# Patient Record
Sex: Female | Born: 1990 | Race: Black or African American | Hispanic: No | Marital: Single | Smoking: Never smoker
Health system: Southern US, Community
[De-identification: ages and names within clinical notes are randomized; demographics above are authoritative.]

## PROBLEM LIST (undated history)

## (undated) ENCOUNTER — Inpatient Hospital Stay (HOSPITAL_COMMUNITY): Payer: Self-pay

## (undated) DIAGNOSIS — N200 Calculus of kidney: Secondary | ICD-10-CM

## (undated) HISTORY — PX: KIDNEY STONE SURGERY: SHX686

---
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
## 2008-02-26 ENCOUNTER — Emergency Department (HOSPITAL_COMMUNITY): Admission: EM | Admit: 2008-02-26 | Discharge: 2008-02-27 | Payer: Self-pay | Admitting: Emergency Medicine

## 2009-08-09 ENCOUNTER — Emergency Department (HOSPITAL_COMMUNITY): Admission: EM | Admit: 2009-08-09 | Discharge: 2009-08-09 | Payer: Self-pay | Admitting: Emergency Medicine
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
## 2010-03-18 ENCOUNTER — Emergency Department (HOSPITAL_COMMUNITY)
Admission: EM | Admit: 2010-03-18 | Discharge: 2010-03-18 | Disposition: A | Discharge status: Home / Self Care | Payer: Medicaid Other | Attending: Emergency Medicine | Admitting: Emergency Medicine

## 2010-03-18 ENCOUNTER — Emergency Department (HOSPITAL_COMMUNITY): Payer: Medicaid Other

## 2010-03-18 DIAGNOSIS — W268XXA Contact with other sharp object(s), not elsewhere classified, initial encounter: Secondary | ICD-10-CM | POA: Insufficient documentation

## 2010-03-18 DIAGNOSIS — Y93G1 Activity, food preparation and clean up: Secondary | ICD-10-CM | POA: Insufficient documentation

## 2010-03-18 DIAGNOSIS — S61409A Unspecified open wound of unspecified hand, initial encounter: Secondary | ICD-10-CM | POA: Insufficient documentation

## 2010-03-19 ENCOUNTER — Emergency Department (HOSPITAL_COMMUNITY)
Admission: EM | Admit: 2010-03-19 | Discharge: 2010-03-19 | Disposition: A | Discharge status: Home / Self Care | Payer: Medicaid Other | Attending: Emergency Medicine | Admitting: Emergency Medicine

## 2010-03-19 DIAGNOSIS — M79609 Pain in unspecified limb: Secondary | ICD-10-CM | POA: Insufficient documentation

## 2010-04-29 LAB — URINALYSIS, ROUTINE W REFLEX MICROSCOPIC
Glucose, UA: NEGATIVE mg/dL
Ketones, ur: NEGATIVE mg/dL
Protein, ur: NEGATIVE mg/dL
Urobilinogen, UA: 0.2 mg/dL (ref 0.0–1.0)

## 2010-04-29 LAB — POCT PREGNANCY, URINE: Preg Test, Ur: NEGATIVE

## 2010-05-28 LAB — CARBOXYHEMOGLOBIN
Carboxyhemoglobin: 0.9 % (ref 0.5–1.5)
Methemoglobin: 0.4 % (ref 0.0–1.5)
O2 Saturation: 46 %
Total hemoglobin: 11.6 g/dL — ABNORMAL LOW (ref 12.5–16.0)

## 2010-06-26 NOTE — Op Note (Signed)
Amy Dickson, Amy Dickson               ACCOUNT NO.:  [id number]   MEDICAL RECORD NO.:  [id number]          PATIENT TYPE:  AMB   LOCATION:  [facility]                         FACILITY:  [facility]   PHYSICIAN:  Mark C. Vernie Ammons, M.D.  DATE OF BIRTH:  [birth year]   DATE OF PROCEDURE:  10/12/2007  DATE OF DISCHARGE:                               OPERATIVE REPORT   PREOPERATIVE DIAGNOSES:  1. History of right ureteral calculus.  2. Left-sided flank pain.   POSTOPERATIVE DIAGNOSES:  1. History of right ureteral calculus.  2. Left ureteral calculus.   PROCEDURES:  1. Cystoscopy.  2. Ureteral catheterization.  3. Bilateral retrograde pyelograms with interpretation.  4. Bilateral ureteroscopy.  5. Left ureteroscopic stone extraction.   SURGEON:  Mark C. Vernie Ammons, M.D.   ANESTHESIA:  General.   DRAINS:  None.   SPECIMENS:  Stone sent for analysis.   BLOOD LOSS:  None.   COMPLICATIONS:  None.   INDICATIONS:  The patient is an 20 year old female who I initially saw  about 2 weeks ago with right flank pain that was evaluated in the  emergency room.  A CT scan revealed a 3 mm distal right ureteral  calculus and a 2 mm upper pole left renal calculus.  When she returned  for 1 week follow-up, she was continuing to have some right sided pain  but then complaint of some left-sided flank pain as well.  She reports  not having seen the stone pass, although she has not been straining her  urine.  She is brought to the operating room today for evaluation of the  right-hand side and extraction of the stone as well as left retrograde  pyelogram to rule out any abnormality on the left-hand side.  The risks,  complications and alternatives have been discussed.  She understands and  elected to proceed.   DESCRIPTION OF OPERATION:  After informed consent, the patient was  brought to the major OR, placed on the table, administered general  anesthesia, then moved to the dorsal lithotomy position.  Her  genitalia  was sterilely prepped and draped and an official time-out was then  performed.  Initially the 22-French cystoscope with 12 degree lens was  inserted in the bladder and the bladder was fully inspected and noted to  be free of any tumor, stones or inflammatory lesions.   Both ureteral orifices appeared entirely normal.  I first cannulated the  right ureteral orifice with a 6-French open-ended ureteral catheter.  I  then injected full strength contrast in order to perform a right  retrograde pyelogram.  This was done under direct fluoroscopy and  revealed no evidence of hydronephrosis and I did not see any definite  filling defects in the ureter.  Intrarenal collecting system appeared  normal as well.   A left retrograde pyelogram was then performed in an identical fashion  and in doing so, I did identify what appeared to be a filling defect in  the distal ureter about a centimeter above the UVJ.  No other  abnormality was identified.   Through the 6-French open-ended catheter, I passed a  0.038 inch floppy  tip guidewire up the left ureter and then used the inner portion of the  ureteral access sheath to very gently dilate the left distal ureter and  ureteral orifice.  I then removed the access sheath and inserted the 6-  French rigid ureteroscope next to the guidewire and passed this up the  ureter under direct visualization and was able to identify a stone.  It  was photographed, grasped with nitinol basket and extracted without  difficulty.  Because it was easily removed with minimal ureteral trauma,  I did not feel a stent was necessary on this side.   I then reinserted the cystoscope, passed the guidewire into the right  ureteral orifice and gently dilated the right distal ureter in a similar  fashion with the internal portion of the ureteral access sheath.  I then  removed that and passed the 6-French rigid ureteroscope into the right  orifice and gently passed this up  the ureter and found no stone present  in the distal right ureter.  I therefore drained the bladder and the  patient was awakened and taken to recovery in stable satisfactory  condition.  She tolerated the procedure well.  There were no  intraoperative complications.   She has a prescription for pain medication already and her stone will be  taken to my office and sent for compositional analysis.  I will then  contact her with the results of that and recommendations on how to  prevent further stone formation.      Mark C. Vernie Ammons, M.D.  Electronically Signed     MCO/MEDQ  D:  10/12/2007  T:  10/12/2007  Job:  [id number]

## 2010-11-09 LAB — URINE MICROSCOPIC-ADD ON

## 2010-11-09 LAB — URINALYSIS, ROUTINE W REFLEX MICROSCOPIC
Nitrite: NEGATIVE
Specific Gravity, Urine: 1.02
Urobilinogen, UA: 1
pH: 5.5

## 2010-11-09 LAB — DIFFERENTIAL
Basophils Absolute: 0
Basophils Relative: 0
Eosinophils Absolute: 0.1
Neutrophils Relative %: 89 — ABNORMAL HIGH

## 2010-11-09 LAB — BASIC METABOLIC PANEL
BUN: 3 — ABNORMAL LOW
CO2: 22
Calcium: 8.3 — ABNORMAL LOW
Chloride: 106
Creatinine, Ser: 0.7

## 2010-11-09 LAB — CBC
MCHC: 33
MCV: 93.7
Platelets: 251
RDW: 12.9

## 2013-01-08 ENCOUNTER — Encounter (HOSPITAL_COMMUNITY): Payer: Self-pay | Admitting: Family

## 2013-01-08 ENCOUNTER — Inpatient Hospital Stay (HOSPITAL_COMMUNITY)
Admission: AD | Admit: 2013-01-08 | Discharge: 2013-01-08 | Disposition: A | Discharge status: Home / Self Care | Payer: Medicaid Other | Source: Ambulatory Visit | Attending: Obstetrics and Gynecology | Admitting: Obstetrics and Gynecology

## 2013-01-08 DIAGNOSIS — R51 Headache: Secondary | ICD-10-CM | POA: Insufficient documentation

## 2013-01-08 DIAGNOSIS — O219 Vomiting of pregnancy, unspecified: Secondary | ICD-10-CM

## 2013-01-08 DIAGNOSIS — O210 Mild hyperemesis gravidarum: Secondary | ICD-10-CM | POA: Insufficient documentation

## 2013-01-08 DIAGNOSIS — R109 Unspecified abdominal pain: Secondary | ICD-10-CM | POA: Insufficient documentation

## 2013-01-08 HISTORY — DX: Calculus of kidney: N20.0

## 2013-01-08 LAB — URINALYSIS, ROUTINE W REFLEX MICROSCOPIC
Hgb urine dipstick: NEGATIVE
Nitrite: NEGATIVE
Protein, ur: NEGATIVE mg/dL
Urobilinogen, UA: 0.2 mg/dL (ref 0.0–1.0)

## 2013-01-08 LAB — POCT PREGNANCY, URINE: Preg Test, Ur: POSITIVE — AB

## 2013-01-08 MED ORDER — ONDANSETRON 8 MG PO TBDP
8.0000 mg | ORAL_TABLET | ORAL | Status: AC
  Administered 2013-01-08: 8 mg via ORAL
  Filled 2013-01-08: qty 1

## 2013-01-08 MED ORDER — PROMETHAZINE HCL 25 MG PO TABS: 12.5000 mg | ORAL_TABLET | Freq: Four times a day (QID) | ORAL | Status: DC | PRN

## 2013-01-08 MED ORDER — ONDANSETRON HCL 4 MG PO TABS: 4.0000 mg | ORAL_TABLET | Freq: Every day | ORAL | Status: DC | PRN

## 2013-01-08 NOTE — MAU Provider Note (Signed)
Chief Complaint: Emesis   First Provider Initiated Contact with Patient 01/08/13 1206     SUBJECTIVE HPI: Amy Dickson is a 22 y.o. G1P0 at [redacted]w[redacted]d by LMP who presents to maternity admissions reporting nausea and vomiting, with vomiting x4 in 24 hours.  She has appt at [facility] in December and currently does not have any nausea medications.  She is waiting for her Medicaid insurance, but has applied.  She reports some light cramping a few days ago but no pain today.  She denies abdominal pain, vaginal bleeding, vaginal itching/burning, urinary symptoms, h/a, dizziness, or fever/chills.   Patient's last menstrual period was 11/01/2012.  She is unsure of this date.  Past Medical History  Diagnosis Date  . Medical history non-contributory   . Kidney stones 2009   Past Surgical History  Procedure Laterality Date  . Kidney stone surgery     History   Social History  . Marital Status: Single    Spouse Name: N/A    Number of Children: N/A  . Years of Education: N/A   Occupational History  . Not on file.   Social History Main Topics  . Smoking status: Never Smoker   . Smokeless tobacco: Not on file  . Alcohol Use: No  . Drug Use: No  . Sexual Activity: Yes   Other Topics Concern  . Not on file   Social History Narrative  . No narrative on file   No current facility-administered medications on file prior to encounter.   No current outpatient prescriptions on file prior to encounter.   No Known Allergies  ROS: Pertinent items in HPI  OBJECTIVE Blood pressure 120/76, pulse 79, temperature 98.5 F (36.9 C), temperature source Oral, resp. rate 18, height 5\' 5"  (1.651 m), weight 50.066 kg (110 lb 6 oz), last menstrual period 11/01/2012. GENERAL: Well-developed, well-nourished female in no acute distress.  HEENT: Normocephalic HEART: normal rate, rhythm, heart sounds RESP: normal effort, lung sounds clear and equal bilaterally ABDOMEN: Soft, non-tender EXTREMITIES: Nontender,  no edema NEURO: Alert and oriented   LAB RESULTS Results for orders placed during the hospital encounter of 01/08/13 (from the past 24 hour(s))  URINALYSIS, ROUTINE W REFLEX MICROSCOPIC     Status: Abnormal   Collection Time    01/08/13 10:50 AM      Result Value Range   Color, Urine YELLOW  YELLOW   APPearance CLEAR  CLEAR   Specific Gravity, Urine 1.020  1.005 - 1.030   pH 6.5  5.0 - 8.0   Glucose, UA NEGATIVE  NEGATIVE mg/dL   Hgb urine dipstick NEGATIVE  NEGATIVE   Bilirubin Urine NEGATIVE  NEGATIVE   Ketones, ur 15 (*) NEGATIVE mg/dL   Protein, ur NEGATIVE  NEGATIVE mg/dL   Urobilinogen, UA 0.2  0.0 - 1.0 mg/dL   Nitrite NEGATIVE  NEGATIVE   Leukocytes, UA NEGATIVE  NEGATIVE  POCT PREGNANCY, URINE     Status: Abnormal   Collection Time    01/08/13 10:56 AM      Result Value Range   Preg Test, Ur POSITIVE (*) NEGATIVE    ASSESSMENT 1. Nausea/vomiting in pregnancy     PLAN Zofran ODT 8 mg x1 in MAU--pt tolerated PO fluids after medication Discharge home Zofran 4 mg PO Q 8 hours PRN Phenergan 12.5-25 mg PO Q 6 hours PRN Outpatient viability U/S ordered F/U as scheduled at health department Return to MAU as needed, or for vaginal bleeding or abdominal pain    Medication List  ASK your doctor about these medications       prenatal multivitamin Tabs tablet  Take 1 tablet by mouth daily at 12 noon.         Sharen Counter Certified Nurse-Midwife 01/08/2013  12:12 PM

## 2013-01-08 NOTE — MAU Note (Signed)
Pt states here for excessive vomiting. Has had hyperemesis however seems to have worsened. Denies diarrhea. Has also had headaches. Unable to keep water down. Last attempted food yesterday around 1330. Denies bleeding or abnormal vaginal discharge. LMP-11/01/2012

## 2013-01-08 NOTE — MAU Note (Signed)
22 yo, G1P0 at [redacted]w[redacted]d, presents to MAU with c/o N/V x 2 days. No medications taken at this time, only prenatal. Denies VB.

## 2013-01-09 NOTE — MAU Provider Note (Signed)
Attestation of Attending Supervision of Advanced Practitioner: Evaluation and management procedures were performed by the PA/NP/CNM/OB Fellow under my supervision/collaboration. Chart reviewed and agree with management and plan.  Leta Bucklin V 01/09/2013 10:13 PM

## 2013-01-15 ENCOUNTER — Ambulatory Visit (HOSPITAL_COMMUNITY)
Admission: RE | Admit: 2013-01-15 | Discharge: 2013-01-15 | Disposition: A | Discharge status: Home / Self Care | Payer: Medicaid Other | Source: Ambulatory Visit | Attending: Advanced Practice Midwife | Admitting: Advanced Practice Midwife

## 2013-01-15 DIAGNOSIS — O219 Vomiting of pregnancy, unspecified: Secondary | ICD-10-CM

## 2013-01-15 DIAGNOSIS — O210 Mild hyperemesis gravidarum: Secondary | ICD-10-CM | POA: Insufficient documentation

## 2013-01-15 DIAGNOSIS — Z3689 Encounter for other specified antenatal screening: Secondary | ICD-10-CM | POA: Insufficient documentation

## 2013-01-20 ENCOUNTER — Ambulatory Visit (INDEPENDENT_AMBULATORY_CARE_PROVIDER_SITE_OTHER): Payer: Medicaid Other | Admitting: Obstetrics and Gynecology

## 2013-01-20 ENCOUNTER — Encounter: Payer: Self-pay | Admitting: Obstetrics and Gynecology

## 2013-01-20 VITALS — BP 116/80 | Temp 98.9°F | Wt 115.1 lb

## 2013-01-20 DIAGNOSIS — Z23 Encounter for immunization: Secondary | ICD-10-CM

## 2013-01-20 DIAGNOSIS — O219 Vomiting of pregnancy, unspecified: Secondary | ICD-10-CM

## 2013-01-20 DIAGNOSIS — O210 Mild hyperemesis gravidarum: Secondary | ICD-10-CM

## 2013-01-20 DIAGNOSIS — Z3401 Encounter for supervision of normal first pregnancy, first trimester: Secondary | ICD-10-CM

## 2013-01-20 DIAGNOSIS — N200 Calculus of kidney: Secondary | ICD-10-CM

## 2013-01-20 LAB — POCT URINALYSIS DIP (DEVICE)
Protein, ur: NEGATIVE mg/dL
Specific Gravity, Urine: 1.02 (ref 1.005–1.030)
Urobilinogen, UA: 0.2 mg/dL (ref 0.0–1.0)

## 2013-01-20 NOTE — Patient Instructions (Signed)
Pregnancy - First Trimester  During sexual intercourse, millions of sperm go into the vagina. Only 1 sperm will penetrate and fertilize the female egg while it is in the Fallopian tube. One week later, the fertilized egg implants into the wall of the uterus. An embryo begins to develop into a baby. At 6 to 8 weeks, the eyes and face are formed and the heartbeat can be seen on ultrasound. At the end of 12 weeks (first trimester), all the baby's organs are formed. Now that you are pregnant, you will want to do everything you can to have a healthy baby. Two of the most important things are to get good prenatal care and follow your caregiver's instructions. Prenatal care is all the medical care you receive before the baby's birth. It is given to prevent, find, and treat problems during the pregnancy and childbirth.  PRENATAL EXAMS  · During prenatal visits, your weight, blood pressure, and urine are checked. This is done to make sure you are healthy and progressing normally during the pregnancy.  · A pregnant woman should gain 25 to 35 pounds during the pregnancy. However, if you are overweight or underweight, your caregiver will advise you regarding your weight.  · Your caregiver will ask and answer questions for you.  · Blood work, cervical cultures, other necessary tests, and a Pap test are done during your prenatal exams. These tests are done to check on your health and the probable health of your baby. Tests are strongly recommended and done for HIV with your permission. This is the virus that causes AIDS. These tests are done because medicines can be given to help prevent your baby from being born with this infection should you have been infected without knowing it. Blood work is also used to find out your blood type, previous infections, and follow your blood levels (hemoglobin).  · Low hemoglobin (anemia) is common during pregnancy. Iron and vitamins are given to help prevent this. Later in the pregnancy, blood  tests for diabetes will be done along with any other tests if any problems develop.  · You may need other tests to make sure you and the baby are doing well.  CHANGES DURING THE FIRST TRIMESTER   Your body goes through many changes during pregnancy. They vary from person to person. Talk to your caregiver about changes you notice and are concerned about. Changes can include:  · Your menstrual period stops.  · The egg and sperm carry the genes that determine what you look like. Genes from you and your partner are forming a baby. The female genes determine whether the baby is a boy or a girl.  · Your body increases in girth and you may feel bloated.  · Feeling sick to your stomach (nauseous) and throwing up (vomiting). If the vomiting is uncontrollable, call your caregiver.  · Your breasts will begin to enlarge and become tender.  · Your nipples may stick out more and become darker.  · The need to urinate more. Painful urination may mean you have a bladder infection.  · Tiring easily.  · Loss of appetite.  · Cravings for certain kinds of food.  · At first, you may gain or lose a couple of pounds.  · You may have changes in your emotions from day to day (excited to be pregnant or concerned something may go wrong with the pregnancy and baby).  · You may have more vivid and strange dreams.  HOME CARE INSTRUCTIONS   ·   It is very important to avoid all smoking, alcohol and non-prescribed drugs during your pregnancy. These affect the formation and growth of the baby. Avoid chemicals while pregnant to ensure the delivery of a healthy infant.  · Start your prenatal visits by the 12th week of pregnancy. They are usually scheduled monthly at first, then more often in the last 2 months before delivery. Keep your caregiver's appointments. Follow your caregiver's instructions regarding medicine use, blood and lab tests, exercise, and diet.  · During pregnancy, you are providing food for you and your baby. Eat regular, well-balanced  meals. Choose foods such as meat, fish, milk and other low fat dairy products, vegetables, fruits, and whole-grain breads and cereals. Your caregiver will tell you of the ideal weight gain.  · You can help morning sickness by keeping soda crackers at the bedside. Eat a couple before arising in the morning. You may want to use the crackers without salt on them.  · Eating 4 to 5 small meals rather than 3 large meals a day also may help the nausea and vomiting.  · Drinking liquids between meals instead of during meals also seems to help nausea and vomiting.  · A physical sexual relationship may be continued throughout pregnancy if there are no other problems. Problems may be early (premature) leaking of amniotic fluid from the membranes, vaginal bleeding, or belly (abdominal) pain.  · Exercise regularly if there are no restrictions. Check with your caregiver or physical therapist if you are unsure of the safety of some of your exercises. Greater weight gain will occur in the last 2 trimesters of pregnancy. Exercising will help:  · Control your weight.  · Keep you in shape.  · Prepare you for labor and delivery.  · Help you lose your pregnancy weight after you deliver your baby.  · Wear a good support or jogging bra for breast tenderness during pregnancy. This may help if worn during sleep too.  · Ask when prenatal classes are available. Begin classes when they are offered.  · Do not use hot tubs, steam rooms, or saunas.  · Wear your seat belt when driving. This protects you and your baby if you are in an accident.  · Avoid raw meat, uncooked cheese, cat litter boxes, and soil used by cats throughout the pregnancy. These carry germs that can cause birth defects in the baby.  · The first trimester is a good time to visit your dentist for your dental health. Getting your teeth cleaned is okay. Use a softer toothbrush and brush gently during pregnancy.  · Ask for help if you have financial, counseling, or nutritional needs  during pregnancy. Your caregiver will be able to offer counseling for these needs as well as refer you for other special needs.  · Do not take any medicines or herbs unless told by your caregiver.  · Inform your caregiver if there is any mental or physical domestic violence.  · Make a list of emergency phone numbers of family, friends, hospital, and police and fire departments.  · Write down your questions. Take them to your prenatal visit.  · Do not douche.  · Do not cross your legs.  · If you have to stand for long periods of time, rotate you feet or take small steps in a circle.  · You may have more vaginal secretions that may require a sanitary pad. Do not use tampons or scented sanitary pads.  MEDICINES AND DRUG USE IN PREGNANCY  ·   Take prenatal vitamins as directed. The vitamin should contain 1 milligram of folic acid. Keep all vitamins out of reach of children. Only a couple vitamins or tablets containing iron may be fatal to a baby or young child when ingested.  · Avoid use of all medicines, including herbs, over-the-counter medicines, not prescribed or suggested by your caregiver. Only take over-the-counter or prescription medicines for pain, discomfort, or fever as directed by your caregiver. Do not use aspirin, ibuprofen, or naproxen unless directed by your caregiver.  · Let your caregiver also know about herbs you may be using.  · Alcohol is related to a number of birth defects. This includes fetal alcohol syndrome. All alcohol, in any form, should be avoided completely. Smoking will cause low birth rate and premature babies.  · Street or illegal drugs are very harmful to the baby. They are absolutely forbidden. A baby born to an addicted mother will be addicted at birth. The baby will go through the same withdrawal an adult does.  · Let your caregiver know about any medicines that you have to take and for what reason you take them.  SEEK MEDICAL CARE IF:   You have any concerns or worries during your  pregnancy. It is better to call with your questions if you feel they cannot wait, rather than worry about them.  SEEK IMMEDIATE MEDICAL CARE IF:   · An unexplained oral temperature above 102° F (38.9° C) develops, or as your caregiver suggests.  · You have leaking of fluid from the vagina (birth canal). If leaking membranes are suspected, take your temperature and inform your caregiver of this when you call.  · There is vaginal spotting or bleeding. Notify your caregiver of the amount and how many pads are used.  · You develop a bad smelling vaginal discharge with a change in the color.  · You continue to feel sick to your stomach (nauseated) and have no relief from remedies suggested. You vomit blood or coffee ground-like materials.  · You lose more than 2 pounds of weight in 1 week.  · You gain more than 2 pounds of weight in 1 week and you notice swelling of your face, hands, feet, or legs.  · You gain 5 pounds or more in 1 week (even if you do not have swelling of your hands, face, legs, or feet).  · You get exposed to German measles and have never had them.  · You are exposed to fifth disease or chickenpox.  · You develop belly (abdominal) pain. Round ligament discomfort is a common non-cancerous (benign) cause of abdominal pain in pregnancy. Your caregiver still must evaluate this.  · You develop headache, fever, diarrhea, pain with urination, or shortness of breath.  · You fall or are in a car accident or have any kind of trauma.  · There is mental or physical violence in your home.  Document Released: 01/22/2001 Document Revised: 10/23/2011 Document Reviewed: 07/26/2008  [employer]® Patient Information ©2014 [employer].

## 2013-01-20 NOTE — Progress Notes (Signed)
   Subjective:    Amy Dickson is a G1P0000 [redacted]w[redacted]d being seen today for her first obstetrical visit.  Her obstetrical history is significant for hx. kidney stones, surgical removal.. Patient does intend to breast feed. Pregnancy history fully reviewed.  Patient reports nausea and vomiting.  Filed Vitals:   01/20/13 0915  BP: 116/80  Temp: 98.9 F (37.2 C)  Weight: 52.209 kg (115 lb 1.6 oz)    HISTORY: OB History  Gravida Para Term Preterm AB SAB TAB Ectopic Multiple Living  1 0 0 0 0 0 0 0 0 0     # Outcome Date GA Lbr Len/2nd Weight Sex Delivery Anes PTL Lv  1 CUR              Past Medical History  Diagnosis Date  . Medical history non-contributory   . Kidney stones 2009   Past Surgical History  Procedure Laterality Date  . Kidney stone surgery     Family History  Problem Relation Age of Onset  . Cancer Maternal Grandmother   . Diabetes Paternal Grandmother      Exam    Uterus:     Pelvic Exam:    Perineum: No Hemorrhoids, Normal Perineum   Vulva: normal   Vagina:  normal mucosa, normal discharge       Cervix: no bleeding following Pap, no lesions and nulliparous appearance   Adnexa: not evaluated   Bony Pelvis: average  System: Breast:  normal appearance, no masses or tenderness   Skin: normal coloration and turgor, no rashes    Neurologic: oriented, normal, grossly non-focal   Extremities: normal strength, tone, and muscle mass   HEENT PERRLA   Mouth/Teeth mucous membranes moist, pharynx normal without lesions   Neck supple and no masses   Cardiovascular: regular rate and rhythm No m   Respiratory:  appears well, vitals normal, no respiratory distress, acyanotic, normal RR, ear and throat exam is normal, neck free of mass or lymphadenopathy, chest clear, no wheezing, crepitations, rhonchi, normal symmetric air entry   Abdomen: S=D FHR 160   Urinary: urethral meatus normal      Assessment:    Pregnancy: G1P0000 Patient Active Problem List   Diagnosis Date Noted  . Nausea and vomiting in pregnancy prior to [redacted] weeks gestation 01/20/2013  . Encounter for supervision of normal first pregnancy in first trimester 01/20/2013  . NEPHROLITHIASIS 09/24/2007        Plan:     Initial labs drawn. Prenatal vitamins. Problem list reviewed and updated. Genetic Screening discussed Quad Screen: requested.  Ultrasound discussed; fetal survey: requested.  Follow up in 4 weeks. 50% of 30 min visit spent on counseling and coordination of care.  Advised increase fluids Rx Phenergan   Kairi Tufo 01/20/2013

## 2013-01-20 NOTE — Progress Notes (Signed)
Pulse- 110 Patient reports some abdominal discomfort, having problems with nausea and vomiting

## 2013-01-21 LAB — OBSTETRIC PANEL
Basophils Absolute: 0 10*3/uL (ref 0.0–0.1)
Eosinophils Absolute: 0.1 10*3/uL (ref 0.0–0.7)
Eosinophils Relative: 2 % (ref 0–5)
Lymphocytes Relative: 18 % (ref 12–46)
MCH: 30.1 pg (ref 26.0–34.0)
MCV: 87.2 fL (ref 78.0–100.0)
Platelets: 245 10*3/uL (ref 150–400)
RDW: 13.6 % (ref 11.5–15.5)
WBC: 5.2 10*3/uL (ref 4.0–10.5)

## 2013-01-21 LAB — PRESCRIPTION MONITORING PROFILE (19 PANEL)
Barbiturate Screen, Urine: NEGATIVE ng/mL
Benzodiazepine Screen, Urine: NEGATIVE ng/mL
Meperidine, Ur: NEGATIVE ng/mL
Nitrites, Initial: NEGATIVE ug/mL
Opiate Screen, Urine: NEGATIVE ng/mL
Propoxyphene: NEGATIVE ng/mL
Tramadol Scrn, Ur: NEGATIVE ng/mL
Zolpidem, Urine: NEGATIVE ng/mL
pH, Initial: 7.3 pH (ref 4.5–8.9)

## 2013-01-21 LAB — ALCOHOL METABOLITE (ETG), URINE: Ethyl Glucuronide (EtG): NEGATIVE ng/mL

## 2013-01-21 LAB — HIV ANTIBODY (ROUTINE TESTING W REFLEX): HIV: NONREACTIVE

## 2013-01-22 LAB — HEMOGLOBINOPATHY EVALUATION
Hgb A2 Quant: 2.7 % (ref 2.2–3.2)
Hgb A: 97.3 % (ref 96.8–97.8)
Hgb F Quant: 0 % (ref 0.0–2.0)
Hgb S Quant: 0 %

## 2013-01-26 ENCOUNTER — Encounter (HOSPITAL_COMMUNITY): Payer: Self-pay | Admitting: *Deleted

## 2013-01-26 ENCOUNTER — Inpatient Hospital Stay (HOSPITAL_COMMUNITY)
Admission: AD | Admit: 2013-01-26 | Discharge: 2013-01-26 | Disposition: A | Discharge status: Home / Self Care | Payer: Medicaid Other | Source: Ambulatory Visit | Attending: Obstetrics & Gynecology | Admitting: Obstetrics & Gynecology

## 2013-01-26 DIAGNOSIS — O219 Vomiting of pregnancy, unspecified: Secondary | ICD-10-CM

## 2013-01-26 DIAGNOSIS — O210 Mild hyperemesis gravidarum: Secondary | ICD-10-CM | POA: Insufficient documentation

## 2013-01-26 DIAGNOSIS — R1013 Epigastric pain: Secondary | ICD-10-CM | POA: Insufficient documentation

## 2013-01-26 DIAGNOSIS — R51 Headache: Secondary | ICD-10-CM | POA: Insufficient documentation

## 2013-01-26 LAB — URINALYSIS, ROUTINE W REFLEX MICROSCOPIC
Ketones, ur: >80 mg/dL — AB
Leukocytes, UA: NEGATIVE
Nitrite: NEGATIVE
Urobilinogen, UA: 0.2 mg/dL (ref 0.0–1.0)
pH: 7 (ref 5.0–8.0)

## 2013-01-26 LAB — BASIC METABOLIC PANEL
CO2: 25 mEq/L (ref 19–32)
Chloride: 100 mEq/L (ref 96–112)
Creatinine, Ser: 0.51 mg/dL (ref 0.50–1.10)
GFR calc Af Amer: >90 mL/min (ref >90)
GFR calc non Af Amer: >90 mL/min (ref >90)
Potassium: 3.8 mEq/L (ref 3.5–5.1)
Sodium: 135 mEq/L (ref 135–145)

## 2013-01-26 MED ORDER — ONDANSETRON HCL 4 MG PO TABS: 4.0000 mg | ORAL_TABLET | Freq: Four times a day (QID) | ORAL | Status: DC

## 2013-01-26 MED ORDER — SODIUM CHLORIDE 0.9 % IV SOLN
INTRAVENOUS | Status: DC
  Administered 2013-01-26: 19:00:00 via INTRAVENOUS

## 2013-01-26 MED ORDER — ONDANSETRON HCL 4 MG/2ML IJ SOLN
4.0000 mg | Freq: Once | INTRAMUSCULAR | Status: AC
  Administered 2013-01-26: 4 mg via INTRAVENOUS
  Filled 2013-01-26: qty 2

## 2013-01-26 NOTE — MAU Provider Note (Signed)
CSN: [id number]     Arrival date & time 01/26/13  1722 History   None    Chief Complaint  Patient presents with  . Morning Sickness   (Consider location/radiation/quality/duration/timing/severity/associated sxs/prior Treatment) Patient is a 22 y.o. female presenting with vomiting. The history is provided by the patient.  Emesis  This is a recurrent problem. The current episode started yesterday. The problem occurs 5 to 10 times per day. The problem has not changed since onset.The emesis has an appearance of stomach contents. There has been no fever. Associated symptoms include abdominal pain (epigastric), headaches and myalgias. Pertinent negatives include no chills, no cough, no diarrhea, no fever, no sweats and no URI.   Amy Dickson is a 22 y.o. female @ [redacted]w[redacted]d weeks gestation with nausea and vomiting that started yesterday. She has had problems with vomiting since early pregnancy but got better for the past few weeks but now bad again.  Past Medical History  Diagnosis Date  . Medical history non-contributory   . Kidney stones 2009   Past Surgical History  Procedure Laterality Date  . Kidney stone surgery     Family History  Problem Relation Age of Onset  . Cancer Maternal Grandmother   . Diabetes Paternal Grandmother    History  Substance Use Topics  . Smoking status: Never Smoker   . Smokeless tobacco: Never Used  . Alcohol Use: No   OB History   Grav Para Term Preterm Abortions TAB SAB Ect Mult Living   1 0 0 0 0 0 0 0 0 0      Review of Systems  Constitutional: Positive for fatigue. Negative for fever, chills and diaphoresis.  HENT: Negative for congestion, dental problem, ear pain, facial swelling, sinus pressure and sore throat.   Eyes: Negative for photophobia, pain, discharge and visual disturbance.  Respiratory: Negative for cough, chest tightness and wheezing.   Cardiovascular: Negative for chest pain.  Gastrointestinal: Positive for vomiting and abdominal  pain (epigastric). Negative for nausea, diarrhea, constipation and abdominal distention.  Genitourinary: Negative for dysuria, frequency, flank pain and difficulty urinating.  Musculoskeletal: Positive for back pain and myalgias. Negative for gait problem, neck pain and neck stiffness.  Skin: Negative for color change and rash.  Allergic/Immunologic: Negative for immunocompromised state.  Neurological: Positive for light-headedness and headaches. Negative for dizziness, speech difficulty, weakness and numbness.  Psychiatric/Behavioral: Negative for confusion, sleep disturbance and agitation.    Allergies  Review of patient's allergies indicates no known allergies.  Home Medications  No current outpatient prescriptions on file. BP 105/85  Pulse 101  Temp(Src) 98.8 F (37.1 C) (Oral)  Resp 20  Ht 5' 3.5" (1.613 m)  Wt 114 lb (51.71 kg)  BMI 19.87 kg/m2  LMP 11/01/2012 Physical Exam  Nursing note and vitals reviewed. Constitutional: She is oriented to person, place, and time. She appears well-developed and well-nourished. No distress.  HENT:  Head: Normocephalic and atraumatic.  Eyes: Conjunctivae and EOM are normal.  Neck: Neck supple.  Cardiovascular: Tachycardia present.   Pulmonary/Chest: Effort normal and breath sounds normal.  Abdominal: Soft. Bowel sounds are normal. There is tenderness in the epigastric area. There is no rebound and no guarding.  Positive FHT's  Musculoskeletal: Normal range of motion.  Neurological: She is alert and oriented to person, place, and time. No cranial nerve deficit.  Skin: Skin is warm and dry.  Psychiatric: She has a normal mood and affect. Her behavior is normal.    ED Course  Procedures  Results for orders placed during the hospital encounter of 01/26/13 (from the past 24 hour(s))  URINALYSIS, ROUTINE W REFLEX MICROSCOPIC     Status: Abnormal   Collection Time    01/26/13  6:00 PM      Result Value Range   Color, Urine YELLOW  YELLOW    APPearance HAZY (*) CLEAR   Specific Gravity, Urine 1.020  1.005 - 1.030   pH 7.0  5.0 - 8.0   Glucose, UA NEGATIVE  NEGATIVE mg/dL   Hgb urine dipstick NEGATIVE  NEGATIVE   Bilirubin Urine NEGATIVE  NEGATIVE   Ketones, ur >80 (*) NEGATIVE mg/dL   Protein, ur NEGATIVE  NEGATIVE mg/dL   Urobilinogen, UA 0.2  0.0 - 1.0 mg/dL   Nitrite NEGATIVE  NEGATIVE   Leukocytes, UA NEGATIVE  NEGATIVE  BASIC METABOLIC PANEL     Status: None   Collection Time    01/26/13  6:57 PM      Result Value Range   Sodium 135  135 - 145 mEq/L   Potassium 3.8  3.5 - 5.1 mEq/L   Chloride 100  96 - 112 mEq/L   CO2 25  19 - 32 mEq/L   Glucose, Bld 88  70 - 99 mg/dL   BUN 6  6 - 23 mg/dL   Creatinine, Ser 5.78  0.50 - 1.10 mg/dL   Calcium 9.4  8.4 - 46.9 mg/dL   GFR calc non Af Amer >90  >90 mL/min   GFR calc Af Amer >90  >90 mL/min    MDM: patient feeling much better after IV hydration and Zofran  22 y.o. female with nausea and vomiting in pregnancy. Has had phenergan at home but states not helping as much anymore. Will add Zofran. Patient stable for discharge without any immediate complications. She will return for any problems.  I have reviewed this patient's vital signs, nurses notes, appropriate labs and discussed plan of care with the patient. She voices understanding.    Medication List    TAKE these medications       ondansetron 4 MG tablet  Commonly known as:  ZOFRAN  Take 1 tablet (4 mg total) by mouth every 6 (six) hours.      ASK your doctor about these medications       prenatal multivitamin Tabs tablet  Take 1 tablet by mouth daily at 12 noon.     promethazine 25 MG tablet  Commonly known as:  PHENERGAN  Take 0.5-1 tablets (12.5-25 mg total) by mouth every 6 (six) hours as needed for nausea.

## 2013-01-26 NOTE — MAU Note (Signed)
Ever since last night around 1900, has been throwing up.   Was here before and given medicine.  Hasn't been able to keep anything down.  Feel dizzy and light headed, getting a headache now.

## 2013-01-26 NOTE — MAU Provider Note (Signed)
Attestation of Attending Supervision of Advanced Practitioner (CNM/NP): Evaluation and management procedures were performed by the Advanced Practitioner under my supervision and collaboration.  I have reviewed the Advanced Practitioner's note and chart, and I agree with the management and plan.  HARRAWAY-SMITH, Yailen Zemaitis 8:52 PM

## 2013-01-28 ENCOUNTER — Encounter: Payer: Self-pay | Admitting: Obstetrics and Gynecology

## 2013-02-11 NOTE — L&D Delivery Note (Signed)
Delivery Note Called to delivery. At 10:59 PM a viable female was delivered via Vaginal, Spontaneous Delivery (Presentation: LOA;  ).  APGAR: 8, 9. Active third stage of labor with traction and pitocin. Placenta delivered spontaneously, intact.  Cord: 3 vessels with the following complications: None.  Cord pH: n/a  Anesthesia: Epidural  Episiotomy: None Lacerations: 1st degree;Perineal Suture Repair: 3.0 vicryl rapide Est. Blood Loss (mL): 300  Mom to postpartum.  Baby to Couplet care / Skin to Skin.  Amy Dickson, Amy Dickson 07/25/2013, 11:29 PM   I was present for this delivery and agree with the above resident's note.  Sharen CounterLisa Leftwich-Kirby Certified Nurse-Midwife

## 2013-02-19 ENCOUNTER — Inpatient Hospital Stay (HOSPITAL_COMMUNITY)
Admission: AD | Admit: 2013-02-19 | Discharge: 2013-02-19 | Disposition: A | Discharge status: Home / Self Care | Payer: Medicaid Other | Source: Ambulatory Visit | Attending: Obstetrics & Gynecology | Admitting: Obstetrics & Gynecology

## 2013-02-19 ENCOUNTER — Encounter (HOSPITAL_COMMUNITY): Payer: Self-pay | Admitting: *Deleted

## 2013-02-19 ENCOUNTER — Ambulatory Visit (INDEPENDENT_AMBULATORY_CARE_PROVIDER_SITE_OTHER): Payer: Medicaid Other | Admitting: Obstetrics and Gynecology

## 2013-02-19 VITALS — BP 106/79 | Temp 97.2°F | Wt 115.8 lb

## 2013-02-19 DIAGNOSIS — O99019 Anemia complicating pregnancy, unspecified trimester: Secondary | ICD-10-CM | POA: Insufficient documentation

## 2013-02-19 DIAGNOSIS — O210 Mild hyperemesis gravidarum: Secondary | ICD-10-CM

## 2013-02-19 DIAGNOSIS — D649 Anemia, unspecified: Secondary | ICD-10-CM | POA: Insufficient documentation

## 2013-02-19 DIAGNOSIS — O219 Vomiting of pregnancy, unspecified: Secondary | ICD-10-CM

## 2013-02-19 DIAGNOSIS — O99012 Anemia complicating pregnancy, second trimester: Secondary | ICD-10-CM

## 2013-02-19 LAB — CBC
HEMATOCRIT: 26.6 % — AB (ref 36.0–46.0)
Hemoglobin: 9.5 g/dL — ABNORMAL LOW (ref 12.0–15.0)
MCH: 31.1 pg (ref 26.0–34.0)
MCHC: 35.7 g/dL (ref 30.0–36.0)
MCV: 87.2 fL (ref 78.0–100.0)
Platelets: 178 10*3/uL (ref 150–400)
RBC: 3.05 MIL/uL — ABNORMAL LOW (ref 3.87–5.11)
RDW: 12.5 % (ref 11.5–15.5)
WBC: 5.2 10*3/uL (ref 4.0–10.5)

## 2013-02-19 LAB — POCT URINALYSIS DIP (DEVICE)
Bilirubin Urine: NEGATIVE
Glucose, UA: NEGATIVE mg/dL
Hgb urine dipstick: NEGATIVE
Nitrite: NEGATIVE
PH: 7 (ref 5.0–8.0)
PROTEIN: NEGATIVE mg/dL
Specific Gravity, Urine: 1.02 (ref 1.005–1.030)
Urobilinogen, UA: 1 mg/dL (ref 0.0–1.0)

## 2013-02-19 LAB — COMPREHENSIVE METABOLIC PANEL
ALT: 12 U/L (ref 0–35)
AST: 18 U/L (ref 0–37)
Albumin: 2.8 g/dL — ABNORMAL LOW (ref 3.5–5.2)
Alkaline Phosphatase: 34 U/L — ABNORMAL LOW (ref 39–117)
BUN: 5 mg/dL — AB (ref 6–23)
CALCIUM: 8.6 mg/dL (ref 8.4–10.5)
CO2: 22 mEq/L (ref 19–32)
Chloride: 102 mEq/L (ref 96–112)
Creatinine, Ser: 0.52 mg/dL (ref 0.50–1.10)
GFR calc non Af Amer: >90 mL/min (ref >90)
Glucose, Bld: 175 mg/dL — ABNORMAL HIGH (ref 70–99)
Potassium: 3.8 mEq/L (ref 3.7–5.3)
Sodium: 136 mEq/L — ABNORMAL LOW (ref 137–147)
TOTAL PROTEIN: 6 g/dL (ref 6.0–8.3)
Total Bilirubin: 0.2 mg/dL — ABNORMAL LOW (ref 0.3–1.2)

## 2013-02-19 MED ORDER — METOCLOPRAMIDE HCL 5 MG PO TABS: 5.0000 mg | ORAL_TABLET | Freq: Four times a day (QID) | ORAL | Status: DC

## 2013-02-19 MED ORDER — DEXTROSE 5 % IN LACTATED RINGERS IV BOLUS
1000.0000 mL | Freq: Once | INTRAVENOUS | Status: AC
  Administered 2013-02-19: 1000 mL via INTRAVENOUS

## 2013-02-19 MED ORDER — PROMETHAZINE HCL 25 MG PO TABS: 12.5000 mg | ORAL_TABLET | Freq: Four times a day (QID) | ORAL | Status: DC | PRN

## 2013-02-19 MED ORDER — PROMETHAZINE HCL 25 MG/ML IJ SOLN
25.0000 mg | Freq: Once | INTRAVENOUS | Status: AC
  Administered 2013-02-19: 25 mg via INTRAVENOUS
  Filled 2013-02-19: qty 1

## 2013-02-19 MED ORDER — M.V.I. ADULT IV INJ: INJECTION | Freq: Once | INTRAVENOUS | Status: DC

## 2013-02-19 MED ORDER — PROMETHAZINE HCL 25 MG PO TABS: 25.0000 mg | ORAL_TABLET | Freq: Four times a day (QID) | ORAL | Status: DC | PRN

## 2013-02-19 MED ORDER — ONDANSETRON 4 MG PO TBDP: 4.0000 mg | ORAL_TABLET | Freq: Three times a day (TID) | ORAL | Status: DC | PRN

## 2013-02-19 MED ORDER — M.V.I. ADULT IV INJ
Freq: Once | INTRAVENOUS | Status: AC
  Administered 2013-02-19: 14:00:00 via INTRAVENOUS
  Filled 2013-02-19: qty 1000

## 2013-02-19 NOTE — Discharge Instructions (Signed)
Hyperemesis Gravidarum Hyperemesis gravidarum is a severe form of nausea and vomiting that happens during pregnancy. Hyperemesis is worse than morning sickness. It may cause a woman to have nausea or vomiting all day for many days. It may keep a woman from eating and drinking enough food and liquids. Hyperemesis usually occurs during the first half (the first 20 weeks) of pregnancy. It often goes away once a woman is in her second half of pregnancy. However, sometimes hyperemesis continues through an entire pregnancy.  CAUSES  The cause of this condition is not completely known but is thought to be due to changes in the body's hormones when pregnant. It could be the high level of the pregnancy hormone or an increase in estrogen in the body.  SYMPTOMS   Severe nausea and vomiting.  Nausea that does not go away.  Vomiting that does not allow you to keep any food down.  Weight loss and body fluid loss (dehydration).  Having no desire to eat or not liking food you have previously enjoyed. DIAGNOSIS  Your caregiver may ask you about your symptoms. Your caregiver may also order blood tests and urine tests to make sure something else is not causing the problem.  TREATMENT  You may only need medicine to control the problem. If medicines do not control the nausea and vomiting, you will be treated in the hospital to prevent dehydration, acidosis, weight loss, and changes in the electrolytes in your body that may harm the unborn baby (fetus). You may need intravenous (IV) fluids.  HOME CARE INSTRUCTIONS   Take all medicine as directed by your caregiver.  Try eating a couple of dry crackers or toast in the morning before getting out of bed.  Avoid foods and smells that upset your stomach.  Avoid fatty and spicy foods. Eat 5 to 6 small meals a day.  Do not drink when eating meals. Drink between meals.  For snacks, eat high protein foods, such as cheese. Eat or suck on things that have ginger in  them. Ginger helps nausea.  Avoid food preparation. The smell of food can spoil your appetite.  Avoid iron pills and iron in your multivitamins until after 3 to 4 months of being pregnant. SEEK MEDICAL CARE IF:   Your abdominal pain increases since the last time you saw your caregiver.  You have a severe headache.  You develop vision problems.  You feel you are losing weight. SEEK IMMEDIATE MEDICAL CARE IF:   You are unable to keep fluids down.  You vomit blood.  You have constant nausea and vomiting.  You have a fever.  You have excessive weakness, dizziness, fainting, or extreme thirst. MAKE SURE YOU:   Understand these instructions.  Will watch your condition.  Will get help right away if you are not doing well or get worse. Document Released: 01/28/2005 Document Revised: 04/22/2011 Document Reviewed: 04/30/2010 [facility]ExitCare Patient Information 2014 [place], [place].  Hyperemesis Gravidarum Diet Hyperemesis gravidarum is a severe form of morning sickness. It is characterized by frequent and severe vomiting. It happens during the first trimester of pregnancy. It may be caused by the rapid hormone changes that happen during pregnancy. It is associated with a 5% weight loss of pre-pregnancy weight. The hyperemesis diet may be used to lessen symptoms of nausea and vomiting. EATING GUIDELINES  Eat 5 to 6 small meals daily instead of 3 large meals.  Avoid foods with strong smells.  Avoid drinking 30 minutes before and after meals.  Avoid fried  or high-fat foods, such as butter and cream sauces.  Starchy foods are usually well-tolerated, such as cereal, toast, bread, potatoes, pasta, rice, and pretzels.  Eat crackers before you get out of bed in the morning.  Avoid spicy foods.  Ginger may help with nausea. Add  tsp ginger to hot tea or choose ginger tea.  Continue to take your prenatal vitamins as directed by your caregiver. SAMPLE MEAL PLAN Breakfast    cup  oatmeal  1 slice toast  1 tsp heart-healthy margarine  1 tsp jelly  1 scrambled egg Midmorning Snack   1 cup low-fat yogurt Lunch   Plain ham sandwich  Carrot or celery sticks  1 small apple  3 graham crackers Midafternoon Snack   Cheese and crackers Dinner  4 oz pork tenderloin  1 small baked potato  1 tsp margarine   cup broccoli   cup grapes Evening Snack  1 cup pudding Document Released: 11/25/2006 Document Revised: 04/22/2011 Document Reviewed: 06/30/2012 [employer] Patient Information 2014 [place], [place].  Iron Deficiency Anemia There are many types of anemia. Iron deficiency anemia is the most common. Iron deficiency anemia is a decrease in the number of red blood cells caused by too little iron. Without enough iron, your body does not produce enough hemoglobin. Hemoglobin is a substance in red blood cells that carries oxygen to the body's tissues. Iron deficiency anemia may leave you tired and short of breath. CAUSES   Lack of iron in the diet.  This may be seen in infants and children, because there is little iron in milk.  This may be seen in adults who do not eat enough iron-rich foods.  This may be seen in pregnant or breastfeeding women who do not take iron supplements. There is a much higher need for iron intake at these times.  Poor absorption of iron, as seen with intestinal disorders.  Intestinal bleeding.  Heavy periods. SYMPTOMS  Mild anemia may not be noticeable. Symptoms may include:  Fatigue.  Headache.  Pale skin.  Weakness.  Shortness of breath.  Dizziness.  Cold hands and feet.  Fast or irregular heartbeat. DIAGNOSIS  Diagnosis requires a thorough evaluation and physical exam by your caregiver.  Blood tests are generally used to confirm iron deficiency anemia.  Additional tests may be done to find the underlying cause of your anemia. These may include:  Testing for blood in the stool (fecal occult blood  test).  A procedure to see inside the colon and rectum (colonoscopy).  A procedure to see inside the esophagus and stomach (endoscopy). TREATMENT   Correcting the cause of the iron deficiency is the first step.  Medicines, such as oral contraceptives, can make heavy menstrual flows lighter.  Antibiotics and other medicines can be used to treat peptic ulcers.  Surgery may be needed to remove a bleeding polyp, tumor, or fibroid.  Often, iron supplements (ferrous sulfate) are taken.  For the best iron absorption, take these supplements with an empty stomach.  You may need to take the supplements with food if you cannot tolerate them on an empty stomach. Vitamin C improves the absorption of iron. Your caregiver may recommend taking your iron tablets with a glass of orange juice or vitamin C supplement.  Milk and antacids should not be taken at the same time as iron supplements. They may interfere with the absorption of iron.  Iron supplements can cause constipation. A stool softener is often recommended.  Pregnant and breastfeeding women will need to take  extra iron, because their normal diet usually will not provide the required amount.  Patients who cannot tolerate iron by mouth can take it through a vein (intravenously) or by an injection into the muscle. HOME CARE INSTRUCTIONS   Ask your dietitian for help with diet questions.  Take iron and vitamins as directed by your caregiver.  Eat a diet rich in iron. Eat liver, lean beef, whole-grain bread, eggs, dried fruit, and dark green leafy vegetables. SEEK IMMEDIATE MEDICAL CARE IF:   You have a fainting episode. Do not drive yourself. Call your local emergency services (911 in [place] if no other help is available.  You have chest pain, nausea, or vomiting.  You develop severe or increased shortness of breath with activities.  You develop weakness or increased thirst.  You have a rapid heartbeat.  You develop unexplained  sweating or become lightheaded when getting up from a chair or bed. MAKE SURE YOU:   Understand these instructions.  Will watch your condition.  Will get help right away if you are not doing well or get worse. Document Released: 01/26/2000 Document Revised: 04/22/2011 Document Reviewed: 06/06/2009 [facility] Patient Information 2014 [place], [place].

## 2013-02-19 NOTE — MAU Provider Note (Signed)
History     CSN: [id number]  Arrival date and time: 02/19/13 1012   First Provider Initiated Contact with Patient 02/19/13 1235      Chief Complaint  Patient presents with  . Emesis  . Dehydration   HPI  Ms Amy Dickson is a 23 y.o. female G1P0000 at 56w5dwho presents with hyperemesis in pregnany and dehydration. Pt was seen in the clinic today and had greater than 160 ketones on urine sample. Pt was sent up here for IV fluids. Pt has run out of phenergan and cannot afford zofran. She was started on reglan today. Pt vomited one time in the clinic, has not vomited while in MAU.   OB History   Grav Para Term Preterm Abortions TAB SAB Ect Mult Living   1 0 0 0 0 0 0 0 0 0       Past Medical History  Diagnosis Date  . Kidney stones 2009    Past Surgical History  Procedure Laterality Date  . Kidney stone surgery      Family History  Problem Relation Age of Onset  . Cancer Maternal Grandmother   . Diabetes Paternal Grandmother     History  Substance Use Topics  . Smoking status: Never Smoker   . Smokeless tobacco: Never Used  . Alcohol Use: No    Allergies: No Known Allergies  Prescriptions prior to admission  Medication Sig Dispense Refill  . Prenatal Vit-Fe Fumarate-FA (PRENATAL MULTIVITAMIN) TABS tablet Take 1 tablet by mouth daily at 12 noon.      . promethazine (PHENERGAN) 25 MG tablet Take 25 mg by mouth every 6 (six) hours as needed for nausea or vomiting.      . metoCLOPramide (REGLAN) 5 MG tablet Take 1 tablet (5 mg total) by mouth 4 (four) times daily.  30 tablet  1   Results for orders placed during the hospital encounter of 02/19/13 (from the past 48 hour(s))  CBC     Status: Abnormal   Collection Time    02/19/13 12:47 PM      Result Value Range   WBC 5.2  4.0 - 10.5 K/uL   RBC 3.05 (*) 3.87 - 5.11 MIL/uL   Hemoglobin 9.5 (*) 12.0 - 15.0 g/dL   HCT 26.6 (*) 36.0 - 46.0 %   MCV 87.2  78.0 - 100.0 fL   MCH 31.1  26.0 - 34.0 pg   MCHC 35.7   30.0 - 36.0 g/dL   RDW 12.5  11.5 - 15.5 %   Platelets 178  150 - 400 K/uL  COMPREHENSIVE METABOLIC PANEL     Status: Abnormal   Collection Time    02/19/13 12:47 PM      Result Value Range   Sodium 136 (*) 137 - 147 mEq/L   Potassium 3.8  3.7 - 5.3 mEq/L   Chloride 102  96 - 112 mEq/L   CO2 22  19 - 32 mEq/L   Glucose, Bld 175 (*) 70 - 99 mg/dL   BUN 5 (*) 6 - 23 mg/dL   Creatinine, Ser 0.52  0.50 - 1.10 mg/dL   Calcium 8.6  8.4 - 10.5 mg/dL   Total Protein 6.0  6.0 - 8.3 g/dL   Albumin 2.8 (*) 3.5 - 5.2 g/dL   AST 18  0 - 37 U/L   ALT 12  0 - 35 U/L   Alkaline Phosphatase 34 (*) 39 - 117 U/L   Total Bilirubin 0.2 (*) 0.3 - 1.2  mg/dL   GFR calc non Af Amer >90  >90 mL/min   GFR calc Af Amer >90  >90 mL/min   Comment: (NOTE)     The eGFR has been calculated using the CKD EPI equation.     This calculation has not been validated in all clinical situations.     eGFR's persistently <90 mL/min signify possible Chronic Kidney     Disease.    Review of Systems  Constitutional: Positive for chills. Negative for fever.  Gastrointestinal: Positive for nausea and vomiting. Negative for heartburn, abdominal pain, diarrhea and constipation.  Genitourinary: Negative for dysuria, urgency, frequency, hematuria and flank pain.       No vaginal discharge. No vaginal bleeding. No dysuria.    Physical Exam   Blood pressure 108/74, pulse 83, temperature 98.2 F (36.8 C), temperature source Oral, resp. rate 16, last menstrual period 11/01/2012.  Physical Exam  Constitutional: She is oriented to person, place, and time. She appears well-developed and well-nourished. No distress.  HENT:  Head: Normocephalic.  Eyes: Pupils are equal, round, and reactive to light.  Neck: Neck supple.  Respiratory: Effort normal.  GI: Soft.  Musculoskeletal: Normal range of motion.  Neurological: She is alert and oriented to person, place, and time.  Skin: Skin is warm. She is not diaphoretic. There is  pallor.  Psychiatric: Her behavior is normal.    MAU Course  Procedures None  MDM +fht D5LR Phenergan Infusion Multivitamin infusion  Pt tolerated PO fluids and crackers without vomiting   Assessment and Plan   A: Hyperemesis in pregnancy  Anemia is pregnancy   P: Discharge  RX: Phenergan        Zofran (5 pills with 3 refills) pt voices understanding of cost being an issue. Pt will fill 5 pills and take only as needed  Take your prenatal vitamin daily  Follow up in the clinic as scheduled Return to MAU as needed, if symptoms worsen Eat small, frequent meals  Darrelyn Hillock Wilder Kurowski, NP  02/19/2013, 3:04 PM

## 2013-02-19 NOTE — Patient Instructions (Signed)
Hyperemesis Gravidarum  Hyperemesis gravidarum is a severe form of nausea and vomiting that happens during pregnancy. Hyperemesis is worse than morning sickness. It may cause a woman to have nausea or vomiting all day for many days. It may keep a woman from eating and drinking enough food and liquids. Hyperemesis usually occurs during the first half (the first 20 weeks) of pregnancy. It often goes away once a woman is in her second half of pregnancy. However, sometimes hyperemesis continues through an entire pregnancy.   CAUSES   The cause of this condition is not completely known but is thought to be due to changes in the body's hormones when pregnant. It could be the high level of the pregnancy hormone or an increase in estrogen in the body.   SYMPTOMS    Severe nausea and vomiting.   Nausea that does not go away.   Vomiting that does not allow you to keep any food down.   Weight loss and body fluid loss (dehydration).   Having no desire to eat or not liking food you have previously enjoyed.  DIAGNOSIS   Your caregiver may ask you about your symptoms. Your caregiver may also order blood tests and urine tests to make sure something else is not causing the problem.   TREATMENT   You may only need medicine to control the problem. If medicines do not control the nausea and vomiting, you will be treated in the hospital to prevent dehydration, acidosis, weight loss, and changes in the electrolytes in your body that may harm the unborn baby (fetus). You may need intravenous (IV) fluids.   HOME CARE INSTRUCTIONS    Take all medicine as directed by your caregiver.   Try eating a couple of dry crackers or toast in the morning before getting out of bed.   Avoid foods and smells that upset your stomach.   Avoid fatty and spicy foods. Eat 5 to 6 small meals a day.   Do not drink when eating meals. Drink between meals.   For snacks, eat high protein foods, such as cheese. Eat or suck on things that have ginger in  them. Ginger helps nausea.   Avoid food preparation. The smell of food can spoil your appetite.   Avoid iron pills and iron in your multivitamins until after 3 to 4 months of being pregnant.  SEEK MEDICAL CARE IF:    Your abdominal pain increases since the last time you saw your caregiver.   You have a severe headache.   You develop vision problems.   You feel you are losing weight.  SEEK IMMEDIATE MEDICAL CARE IF:    You are unable to keep fluids down.   You vomit blood.   You have constant nausea and vomiting.   You have a fever.   You have excessive weakness, dizziness, fainting, or extreme thirst.  MAKE SURE YOU:    Understand these instructions.   Will watch your condition.   Will get help right away if you are not doing well or get worse.  Document Released: 01/28/2005 Document Revised: 04/22/2011 Document Reviewed: 04/30/2010  [employer] Patient Information 2014 [employer].

## 2013-02-19 NOTE — MAU Note (Signed)
Ongoing problem with vomiting.  Taking promethazine, can't keep it down either.

## 2013-02-19 NOTE — Progress Notes (Signed)
Thin, looks sick. Still N/V and has retained no food or fluids x>24 hr except bites of yogurt today. Cannot afford Zofran. Phenergan taken this am, still nauseated. Needs refill. Vagina feels swollen only after intercourse, no dysparunia, irritative discharge. Vomits frequently. Weight stable, no gain. Zofran 4mg  ODT ordered . Rx Phenergan and Reglan.  UA dip back> ketones>180. Will send to MAU for IV rehydration.

## 2013-02-19 NOTE — Progress Notes (Signed)
Pulse- 99  Vaginal swelling Pt reports having vomiting all the time

## 2013-02-22 ENCOUNTER — Encounter (HOSPITAL_COMMUNITY): Payer: Self-pay | Admitting: *Deleted

## 2013-02-22 ENCOUNTER — Inpatient Hospital Stay (HOSPITAL_COMMUNITY)
Admission: AD | Admit: 2013-02-22 | Discharge: 2013-02-23 | Disposition: A | Discharge status: Home / Self Care | Payer: Medicaid Other | Source: Ambulatory Visit | Attending: Family Medicine | Admitting: Family Medicine

## 2013-02-22 DIAGNOSIS — O210 Mild hyperemesis gravidarum: Secondary | ICD-10-CM | POA: Insufficient documentation

## 2013-02-22 DIAGNOSIS — O219 Vomiting of pregnancy, unspecified: Secondary | ICD-10-CM

## 2013-02-22 DIAGNOSIS — R1115 Cyclical vomiting syndrome unrelated to migraine: Secondary | ICD-10-CM

## 2013-02-22 DIAGNOSIS — R1110 Vomiting, unspecified: Secondary | ICD-10-CM

## 2013-02-22 LAB — URINE MICROSCOPIC-ADD ON

## 2013-02-22 LAB — URINALYSIS, ROUTINE W REFLEX MICROSCOPIC
Bilirubin Urine: NEGATIVE
GLUCOSE, UA: NEGATIVE mg/dL
Hgb urine dipstick: NEGATIVE
Ketones, ur: >80 mg/dL — AB
NITRITE: NEGATIVE
PH: 7 (ref 5.0–8.0)
Protein, ur: NEGATIVE mg/dL
SPECIFIC GRAVITY, URINE: 1.02 (ref 1.005–1.030)
Urobilinogen, UA: 1 mg/dL (ref 0.0–1.0)

## 2013-02-22 MED ORDER — DEXTROSE IN LACTATED RINGERS 5 % IV SOLN
Freq: Once | INTRAVENOUS | Status: AC
  Administered 2013-02-22: 22:00:00 via INTRAVENOUS

## 2013-02-22 MED ORDER — ONDANSETRON HCL 4 MG/2ML IJ SOLN
4.0000 mg | Freq: Once | INTRAMUSCULAR | Status: AC
  Administered 2013-02-22: 4 mg via INTRAVENOUS
  Filled 2013-02-22: qty 2

## 2013-02-22 MED ORDER — M.V.I. ADULT IV INJ
Freq: Once | INTRAVENOUS | Status: AC
  Administered 2013-02-22: 23:00:00 via INTRAVENOUS
  Filled 2013-02-22: qty 10

## 2013-02-22 NOTE — MAU Note (Signed)
Pt with nausea and vomiting, unable to keep anything down.  Has prescriptions for medications but cannot afford to fill them until Friday.

## 2013-02-22 NOTE — MAU Provider Note (Signed)
  History     CSN: [id number]  Arrival date and time: 02/22/13 2028   None     Chief Complaint  Patient presents with  . Morning Sickness  . Emesis During Pregnancy   HPI This is a 23 y.o. female at 8878w1d who presents with c/o nausea and vomiting. Has Rx'es but cannot get them filled until Friday.  Rxed Phenergan and reglan Tried sips of ginger ale but vomited.   RN Note:  Pt with nausea and vomiting, unable to keep anything down. Has prescriptions for medications but cannot afford to fill them until Friday.       OB History   Grav Para Term Preterm Abortions TAB SAB Ect Mult Living   1 0 0 0 0 0 0 0 0 0       Past Medical History  Diagnosis Date  . Kidney stones 2009    Past Surgical History  Procedure Laterality Date  . Kidney stone surgery      Family History  Problem Relation Age of Onset  . Cancer Maternal Grandmother   . Diabetes Paternal Grandmother     History  Substance Use Topics  . Smoking status: Never Smoker   . Smokeless tobacco: Never Used  . Alcohol Use: No    Allergies: No Known Allergies  Prescriptions prior to admission  Medication Sig Dispense Refill  . Prenatal Vit-Fe Fumarate-FA (PRENATAL MULTIVITAMIN) TABS tablet Take 1 tablet by mouth daily at 12 noon.      . metoCLOPramide (REGLAN) 5 MG tablet Take 1 tablet (5 mg total) by mouth 4 (four) times daily.  30 tablet  1  . ondansetron (ZOFRAN ODT) 4 MG disintegrating tablet Take 1 tablet (4 mg total) by mouth every 8 (eight) hours as needed for nausea or vomiting.  5 tablet  2  . promethazine (PHENERGAN) 25 MG tablet Take 1 tablet (25 mg total) by mouth every 6 (six) hours as needed for nausea or vomiting.  30 tablet  2    Review of Systems  Constitutional: Negative for fever, chills and malaise/fatigue.  Gastrointestinal: Positive for nausea and vomiting. Negative for abdominal pain, diarrhea and constipation.  Genitourinary: Negative for dysuria.  Neurological: Positive for  weakness. Negative for dizziness and headaches.   Physical Exam   Blood pressure 100/52, pulse 92, temperature 98.1 F (36.7 C), temperature source Oral, resp. rate 16, height 5\' 5"  (1.651 m), weight 53.524 kg (118 lb), last menstrual period 11/01/2012.  Physical Exam  Constitutional: She is oriented to person, place, and time. She appears well-developed. No distress.  HENT:  Head: Normocephalic.  Cardiovascular: Normal rate.   Respiratory: Effort normal.  GI: Soft. She exhibits no distension. There is no tenderness. There is no rebound and no guarding.  Musculoskeletal: Normal range of motion.  Neurological: She is alert and oriented to person, place, and time.  Skin: Skin is warm and dry.  Psychiatric: She has a normal mood and affect.    MAU Course  Procedures  MDM Got two liters of IVF, one with MVI Zofran iv given with relief No vomiting. Tolerated gingerale  Assessment and Plan  A:  SIUP at 4350w2d       Hyperemesis  P;;Discharge home      Rx Reglan printed out for her      [facility]    Raeshawn Vo 02/22/2013, 9:05 PM

## 2013-02-22 NOTE — Discharge Instructions (Signed)
Hyperemesis Gravidarum  Hyperemesis gravidarum is a severe form of nausea and vomiting that happens during pregnancy. Hyperemesis is worse than morning sickness. It may cause you to have nausea or vomiting all day for many days. It may keep you from eating and drinking enough food and liquids. Hyperemesis usually occurs during the first half (the first 20 weeks) of pregnancy. It often goes away once a woman is in her second half of pregnancy. However, sometimes hyperemesis continues through an entire pregnancy.   CAUSES   The cause of this condition is not completely known but is thought to be related to changes in the body's hormones when pregnant. It could be from the high level of the pregnancy hormone or an increase in estrogen in the body.   SIGNS AND SYMPTOMS   · Severe nausea and vomiting.  · Nausea that does not go away.  · Vomiting that does not allow you to keep any food down.  · Weight loss and body fluid loss (dehydration).  · Having no desire to eat or not liking food you have previously enjoyed.  DIAGNOSIS   Your health care provider will do a physical exam and ask you about your symptoms. He or she may also order blood tests and urine tests to make sure something else is not causing the problem.   TREATMENT   You may only need medicine to control the problem. If medicines do not control the nausea and vomiting, you will be treated in the hospital to prevent dehydration, increased acid in the blood (acidosis), weight loss, and changes in the electrolytes in your body that may harm the unborn baby (fetus). You may need IV fluids.   HOME CARE INSTRUCTIONS   · Only take over-the-counter or prescription medicines as directed by your health care provider.  · Try eating a couple of dry crackers or toast in the morning before getting out of bed.  · Avoid foods and smells that upset your stomach.  · Avoid fatty and spicy foods.  · Eat 5 6 small meals a day.  · Do not drink when eating meals. Drink between  meals.  · For snacks, eat high-protein foods, such as cheese.  · Eat or suck on things that have ginger in them. Ginger helps nausea.  · Avoid food preparation. The smell of food can spoil your appetite.  · Avoid iron pills and iron in your multivitamins until after 3 4 months of being pregnant. However, consult with your health care provider before stopping any prescribed iron pills.  SEEK MEDICAL CARE IF:   · Your abdominal pain increases.  · You have a severe headache.  · You have vision problems.  · You are losing weight.  SEEK IMMEDIATE MEDICAL CARE IF:   · You are unable to keep fluids down.  · You vomit blood.  · You have constant nausea and vomiting.  · You have excessive weakness.  · You have extreme thirst.  · You have dizziness or fainting.  · You have a fever or persistent symptoms for more than 2 3 days.  · You have a fever and your symptoms suddenly get worse.  MAKE SURE YOU:   · Understand these instructions.  · Will watch your condition.  · Will get help right away if you are not doing well or get worse.  Document Released: 01/28/2005 Document Revised: 11/18/2012 Document Reviewed: 09/09/2012  ExitCare® Patient Information ©2014 [employer].

## 2013-02-22 NOTE — MAU Note (Signed)
Artelia LarocheM. Williams CNM at the bedside.

## 2013-02-23 MED ORDER — METOCLOPRAMIDE HCL 5 MG PO TABS: 5.0000 mg | ORAL_TABLET | Freq: Four times a day (QID) | ORAL | Status: DC

## 2013-02-23 NOTE — MAU Provider Note (Signed)
Attestation of Attending Supervision of Advanced Practitioner (PA/CNM/NP): Evaluation and management procedures were performed by the Advanced Practitioner under my supervision and collaboration.  I have reviewed the Advanced Practitioner's note and chart, and I agree with the management and plan.  Abdimalik Mayorquin S, MD [facility] Attending 02/23/2013 12:52 AM   

## 2013-03-11 ENCOUNTER — Inpatient Hospital Stay (HOSPITAL_COMMUNITY)
Admission: AD | Admit: 2013-03-11 | Discharge: 2013-03-11 | Disposition: A | Discharge status: Home / Self Care | Payer: Medicaid Other | Source: Ambulatory Visit | Attending: Obstetrics & Gynecology | Admitting: Obstetrics & Gynecology

## 2013-03-11 ENCOUNTER — Encounter (HOSPITAL_COMMUNITY): Payer: Self-pay | Admitting: General Practice

## 2013-03-11 DIAGNOSIS — R102 Pelvic and perineal pain: Secondary | ICD-10-CM

## 2013-03-11 DIAGNOSIS — O210 Mild hyperemesis gravidarum: Secondary | ICD-10-CM

## 2013-03-11 DIAGNOSIS — O26899 Other specified pregnancy related conditions, unspecified trimester: Secondary | ICD-10-CM

## 2013-03-11 DIAGNOSIS — M25559 Pain in unspecified hip: Secondary | ICD-10-CM | POA: Insufficient documentation

## 2013-03-11 DIAGNOSIS — N949 Unspecified condition associated with female genital organs and menstrual cycle: Secondary | ICD-10-CM | POA: Insufficient documentation

## 2013-03-11 DIAGNOSIS — O9989 Other specified diseases and conditions complicating pregnancy, childbirth and the puerperium: Secondary | ICD-10-CM

## 2013-03-11 LAB — URINALYSIS, ROUTINE W REFLEX MICROSCOPIC
Bilirubin Urine: NEGATIVE
Glucose, UA: NEGATIVE mg/dL
Hgb urine dipstick: NEGATIVE
Ketones, ur: >80 mg/dL — AB
Nitrite: NEGATIVE
Protein, ur: NEGATIVE mg/dL
Specific Gravity, Urine: 1.02 (ref 1.005–1.030)
Urobilinogen, UA: 0.2 mg/dL (ref 0.0–1.0)
pH: 6.5 (ref 5.0–8.0)

## 2013-03-11 LAB — WET PREP, GENITAL
Clue Cells Wet Prep HPF POC: NONE SEEN
TRICH WET PREP: NONE SEEN
Yeast Wet Prep HPF POC: NONE SEEN

## 2013-03-11 LAB — URINE MICROSCOPIC-ADD ON

## 2013-03-11 MED ORDER — CYCLOBENZAPRINE HCL 10 MG PO TABS: 10.0000 mg | ORAL_TABLET | Freq: Three times a day (TID) | ORAL | Status: DC | PRN

## 2013-03-11 MED ORDER — LACTATED RINGERS IV SOLN
INTRAVENOUS | Status: DC
  Administered 2013-03-11: 16:00:00 via INTRAVENOUS

## 2013-03-11 MED ORDER — SODIUM CHLORIDE 0.9 % IV SOLN
25.0000 mg | Freq: Once | INTRAVENOUS | Status: AC
  Administered 2013-03-11: 25 mg via INTRAVENOUS
  Filled 2013-03-11: qty 1

## 2013-03-11 NOTE — MAU Note (Signed)
Pt states she has had, for the past several mornings, pain in lower pelvic area that radiates down her entire leg, down to her feet and when this happens she states she can't walk. She feels paralyzed at that time because she is not able to move and it hurts to touch it. She states she has to wait until it passes. Pt states it takes about an hour for it to pass.

## 2013-03-11 NOTE — Discharge Instructions (Signed)
Hyperemesis Gravidarum Diet °Hyperemesis gravidarum is a severe form of morning sickness. It is characterized by frequent and severe vomiting. It happens during the first trimester of pregnancy. It may be caused by the rapid hormone changes that happen during pregnancy. It is associated with a 5% weight loss of pre-pregnancy weight. The hyperemesis diet may be used to lessen symptoms of nausea and vomiting. °EATING GUIDELINES °· Eat 5 to 6 small meals daily instead of 3 large meals. °· Avoid foods with strong smells. °· Avoid drinking 30 minutes before and after meals. °· Avoid fried or high-fat foods, such as butter and cream sauces. °· Starchy foods are usually well-tolerated, such as cereal, toast, bread, potatoes, pasta, rice, and pretzels. °· Eat crackers before you get out of bed in the morning. °· Avoid spicy foods. °· Ginger may help with nausea. Add ¼ tsp ginger to hot tea or choose ginger tea. °· Continue to take your prenatal vitamins as directed by your caregiver. °SAMPLE MEAL PLAN °Breakfast  °· ½ cup oatmeal °· 1 slice toast °· 1 tsp heart-healthy margarine °· 1 tsp jelly °· 1 scrambled egg °Midmorning Snack  °· 1 cup low-fat yogurt °Lunch  °· Plain ham sandwich °· Carrot or celery sticks °· 1 small apple °· 3 graham crackers °Midafternoon Snack  °· Cheese and crackers °Dinner °· 4 oz pork tenderloin °· 1 small baked potato °· 1 tsp margarine °· ½ cup broccoli °· ½ cup grapes °Evening Snack °· 1 cup pudding °Document Released: 11/25/2006 Document Revised: 04/22/2011 Document Reviewed: 06/30/2012 °ExitCare® Patient Information ©2014 [employer]. ° °Hip Pain °The hips join the upper legs to the lower pelvis. The bones, cartilage, tendons, and muscles of the hip joint perform a lot of work each day holding your body weight and allowing you to move around. °Hip pain is a common symptom. It can range from a minor ache to severe pain on 1 or both hips. Pain may be felt on the inside of the hip joint near  the groin, or the outside near the buttocks and upper thigh. There may be swelling or stiffness as well. It occurs more often when a person walks or performs activity. There are many reasons hip pain can develop. °CAUSES  °It is important to work with your caregiver to identify the cause since many conditions can impact the bones, cartilage, muscles, and tendons of the hips. Causes for hip pain include: °· Broken (fractured) bones. °· Separation of the thighbone from the hip socket (dislocation). °· Torn cartilage of the hip joint. °· Swelling (inflammation) of a tendon (tendonitis), the sac within the hip joint (bursitis), or a joint. °· A weakening in the abdominal wall (hernia), affecting the nerves to the hip. °· Arthritis in the hip joint or lining of the hip joint. °· Pinched nerves in the back, hip, or upper thigh. °· A bulging disc in the spine (herniated disc). °· Rarely, bone infection or cancer. °DIAGNOSIS  °The location of your hip pain will help your caregiver understand what may be causing the pain. A diagnosis is based on your medical history, your symptoms, results from your physical exam, and results from diagnostic tests. Diagnostic tests may include X-ray exams, a computerized magnetic scan (magnetic resonance imaging, MRI), or bone scan. °TREATMENT  °Treatment will depend on the cause of your hip pain. Treatment may include: °· Limiting activities and resting until symptoms improve. °· Crutches or other walking supports (a cane or brace). °· Ice, elevation, and compression. °·   compression.  Physical therapy or home exercises.  Shoe inserts or special shoes.  Losing weight.  Medications to reduce pain.  Undergoing surgery. HOME CARE INSTRUCTIONS   Only take over-the-counter or prescription medicines for pain, discomfort, or fever as directed by your caregiver.  Put ice on the injured area:  Put ice in a plastic bag.  Place a towel between your skin and the bag.  Leave the ice on for  15-20 minutes at a time, 03-04 times a day.  Keep your leg raised (elevated) when possible to lessen swelling.  Avoid activities that cause pain.  Follow specific exercises as directed by your caregiver.  Sleep with a pillow between your legs on your most comfortable side.  Record how often you have hip pain, the location of the pain, and what it feels like. This information may be helpful to you and your caregiver.  Ask your caregiver about returning to work or sports and whether you should drive.  Follow up with your caregiver for further exams, therapy, or testing as directed. SEEK MEDICAL CARE IF:   Your pain or swelling continues or worsens after 1 week.  You are feeling unwell or have chills.  You have increasing difficulty with walking.  You have a loss of sensation or other new symptoms.  You have questions or concerns. SEEK IMMEDIATE MEDICAL CARE IF:   You cannot put weight on the affected hip.  You have fallen.  You have a sudden increase in pain and swelling in your hip.  You have a fever. MAKE SURE YOU:   Understand these instructions.  Will watch your condition.  Will get help right away if you are not doing well or get worse. Document Released: 07/18/2009 Document Revised: 04/22/2011 Document Reviewed: 07/18/2009 [facility] Patient Information 2014 [place], [place].

## 2013-03-11 NOTE — MAU Provider Note (Signed)
History     CSN: [id number]  Arrival date and time: 03/11/13 1406   First Provider Initiated Contact with Patient 03/11/13 1435      No chief complaint on file.  HPI This is a 23 y.o. female at [redacted]w[redacted]d who presents with c/o pain in hips and pelvis on some mornings when she first gets up. Does not hurt much rest of day. Pain is in hips and groin area.  Also continues to have nausea and vomiting, moreso in past two days. Started having 3 loose stools daily yesterday. No fever  RN Note: Pain in pelvis. When she wakes up, feels pain in pelvis and into legs. Feels like she paralyzed. Afraid to walk. Ongoing problem with nausea and vomiting.       OB History   Grav Para Term Preterm Abortions TAB SAB Ect Mult Living   1 0 0 0 0 0 0 0 0 0       Past Medical History  Diagnosis Date  . Kidney stones 2009    Past Surgical History  Procedure Laterality Date  . Kidney stone surgery      Family History  Problem Relation Age of Onset  . Cancer Maternal Grandmother   . Diabetes Paternal Grandmother     History  Substance Use Topics  . Smoking status: Never Smoker   . Smokeless tobacco: Never Used  . Alcohol Use: No    Allergies: No Known Allergies  Prescriptions prior to admission  Medication Sig Dispense Refill  . metoCLOPramide (REGLAN) 5 MG tablet Take 1 tablet (5 mg total) by mouth 4 (four) times daily.  30 tablet  1  . ondansetron (ZOFRAN ODT) 4 MG disintegrating tablet Take 1 tablet (4 mg total) by mouth every 8 (eight) hours as needed for nausea or vomiting.  5 tablet  2  . Prenatal Vit-Fe Fumarate-FA (PRENATAL MULTIVITAMIN) TABS tablet Take 1 tablet by mouth daily at 12 noon.      . promethazine (PHENERGAN) 25 MG tablet Take 1 tablet (25 mg total) by mouth every 6 (six) hours as needed for nausea or vomiting.  30 tablet  2    Review of Systems  Constitutional: Positive for malaise/fatigue. Negative for fever and chills.  Gastrointestinal: Positive for nausea,  vomiting and diarrhea. Negative for abdominal pain and constipation.  Genitourinary: Negative for dysuria.  Neurological: Positive for weakness. Negative for dizziness and headaches.   Physical Exam   Blood pressure 114/77, pulse 95, temperature 99.1 F (37.3 C), temperature source Oral, resp. rate 18, height 5' 4.25" (1.632 m), weight 54.885 kg (121 lb), last menstrual period 11/01/2012.  Physical Exam  Constitutional: She is oriented to person, place, and time. She appears well-developed and well-nourished. No distress.  Cardiovascular: Normal rate.   Respiratory: Effort normal.  GI: Soft. There is no tenderness. There is no rebound and no guarding.  Genitourinary: Uterus normal. Vaginal discharge (with itching) found.  Cervix long and closed   Musculoskeletal: Normal range of motion.  Neurological: She is alert and oriented to person, place, and time.  Skin: Skin is warm and dry.  Psychiatric: She has a normal mood and affect.    MAU Course  Procedures  Results for orders placed during the hospital encounter of 03/11/13 (from the past 24 hour(s))  URINALYSIS, ROUTINE W REFLEX MICROSCOPIC     Status: Abnormal   Collection Time    03/11/13  2:30 PM      Result Value Range   Color, Urine YELLOW  YELLOW   APPearance CLEAR  CLEAR   Specific Gravity, Urine 1.020  1.005 - 1.030   pH 6.5  5.0 - 8.0   Glucose, UA NEGATIVE  NEGATIVE mg/dL   Hgb urine dipstick NEGATIVE  NEGATIVE   Bilirubin Urine NEGATIVE  NEGATIVE   Ketones, ur >80 (*) NEGATIVE mg/dL   Protein, ur NEGATIVE  NEGATIVE mg/dL   Urobilinogen, UA 0.2  0.0 - 1.0 mg/dL   Nitrite NEGATIVE  NEGATIVE   Leukocytes, UA SMALL (*) NEGATIVE  URINE MICROSCOPIC-ADD ON     Status: Abnormal   Collection Time    03/11/13  2:30 PM      Result Value Range   Squamous Epithelial / LPF MANY (*) RARE   WBC, UA 0-2  <3 WBC/hpf   RBC / HPF 0-2  <3 RBC/hpf   Bacteria, UA FEW (*) RARE   Urine-Other RARE YEAST      MDM Will hydrate  with IVF and phenergan.   Assessment and Plan  A:  SIUP at 672w4d        Pelvis pain       Hyperemesis  P:  IV hydration      Refer to ortho or chiropractic     Will Rx Flexeril for some additional relaxation of surrounding muscles       Follow in clinic  [facility],Adelyn Roscher 03/11/2013, 2:35 PM

## 2013-03-11 NOTE — MAU Note (Addendum)
Pain in pelvis.  When she wakes up, feels pain in pelvis and into legs. Feels like she paralyzed. Afraid to walk.  Ongoing problem with nausea and vomiting.

## 2013-03-20 ENCOUNTER — Inpatient Hospital Stay (HOSPITAL_COMMUNITY)
Admission: AD | Admit: 2013-03-20 | Discharge: 2013-03-20 | Disposition: A | Discharge status: Home / Self Care | Payer: Medicaid Other | Source: Ambulatory Visit | Attending: Obstetrics & Gynecology | Admitting: Obstetrics & Gynecology

## 2013-03-20 ENCOUNTER — Encounter (HOSPITAL_COMMUNITY): Payer: Self-pay

## 2013-03-20 DIAGNOSIS — O211 Hyperemesis gravidarum with metabolic disturbance: Secondary | ICD-10-CM

## 2013-03-20 DIAGNOSIS — O210 Mild hyperemesis gravidarum: Secondary | ICD-10-CM | POA: Insufficient documentation

## 2013-03-20 LAB — URINALYSIS, ROUTINE W REFLEX MICROSCOPIC
BILIRUBIN URINE: NEGATIVE
GLUCOSE, UA: NEGATIVE mg/dL
Hgb urine dipstick: NEGATIVE
LEUKOCYTES UA: NEGATIVE
NITRITE: NEGATIVE
PH: 8 (ref 5.0–8.0)
PROTEIN: NEGATIVE mg/dL
Specific Gravity, Urine: 1.015 (ref 1.005–1.030)
Urobilinogen, UA: 0.2 mg/dL (ref 0.0–1.0)

## 2013-03-20 MED ORDER — ONDANSETRON 8 MG PO TBDP: 8.0000 mg | ORAL_TABLET | Freq: Three times a day (TID) | ORAL | Status: DC | PRN

## 2013-03-20 MED ORDER — ONDANSETRON 8 MG PO TBDP
8.0000 mg | ORAL_TABLET | Freq: Once | ORAL | Status: DC
  Filled 2013-03-20: qty 1

## 2013-03-20 MED ORDER — ONDANSETRON HCL 4 MG/2ML IJ SOLN
4.0000 mg | Freq: Once | INTRAMUSCULAR | Status: AC
  Administered 2013-03-20: 4 mg via INTRAVENOUS
  Filled 2013-03-20: qty 2

## 2013-03-20 MED ORDER — FAMOTIDINE IN NACL 20-0.9 MG/50ML-% IV SOLN
20.0000 mg | Freq: Once | INTRAVENOUS | Status: AC
  Administered 2013-03-20: 20 mg via INTRAVENOUS
  Filled 2013-03-20: qty 50

## 2013-03-20 MED ORDER — DEXTROSE 5 % IN LACTATED RINGERS IV BOLUS
1000.0000 mL | Freq: Once | INTRAVENOUS | Status: AC
  Administered 2013-03-20: 1000 mL via INTRAVENOUS

## 2013-03-20 MED ORDER — PROMETHAZINE HCL 25 MG PO TABS: 25.0000 mg | ORAL_TABLET | Freq: Four times a day (QID) | ORAL | Status: DC | PRN

## 2013-03-20 MED ORDER — M.V.I. ADULT IV INJ
Freq: Once | INTRAVENOUS | Status: AC
  Administered 2013-03-20: 14:00:00 via INTRAVENOUS
  Filled 2013-03-20: qty 1000

## 2013-03-20 NOTE — MAU Note (Signed)
Pt presents complaining of worsening nausea and vomiting this week. States she feels very weak because she hasn't been able to eat. Denies vaginal bleeding or discharge.

## 2013-03-20 NOTE — MAU Provider Note (Signed)
History     CSN: [id number]  Arrival date and time: 03/20/13 1032   First Provider Initiated Contact with Patient 03/20/13 1138      Chief Complaint  Patient presents with  . Emesis   HPI Pt is [redacted]w[redacted]d pregnant G1P0000 who presents with nausea and vomiting in pregnancy.  Pt has had ongoing problem with hyperemesis.  Pt has been taking phenergan; she was not able to get zofran due to insurance pending and too expensive.  Pt has an appointment in the clinic Feb 11.  Pt denies vaginal bleeding. Rn note: Pt presents complaining of worsening nausea and vomiting this week. States she feels very weak because she hasn't been able to eat. Denies vaginal bleeding or discharge  Past Medical History  Diagnosis Date  . Kidney stones 2009    Past Surgical History  Procedure Laterality Date  . Kidney stone surgery      Family History  Problem Relation Age of Onset  . Cancer Maternal Grandmother   . Diabetes Paternal Grandmother     History  Substance Use Topics  . Smoking status: Never Smoker   . Smokeless tobacco: Never Used  . Alcohol Use: No    Allergies: No Known Allergies  Prescriptions prior to admission  Medication Sig Dispense Refill  . metoCLOPramide (REGLAN) 5 MG tablet Take 1 tablet (5 mg total) by mouth 4 (four) times daily.  30 tablet  1  . Prenatal Vit-Fe Fumarate-FA (PRENATAL MULTIVITAMIN) TABS tablet Take 1 tablet by mouth daily at 12 noon.      . promethazine (PHENERGAN) 25 MG tablet Take 25 mg by mouth every 6 (six) hours as needed for nausea or vomiting.      . cyclobenzaprine (FLEXERIL) 10 MG tablet Take 1 tablet (10 mg total) by mouth 3 (three) times daily as needed for muscle spasms.  20 tablet  0    Review of Systems  Constitutional: Negative for fever and chills.  Gastrointestinal: Positive for nausea, vomiting and abdominal pain. Negative for diarrhea and constipation.  Genitourinary: Negative for dysuria and urgency.   Physical Exam   Blood pressure  104/72, pulse 86, temperature 98 F (36.7 C), temperature source Oral, resp. rate 18, last menstrual period 11/01/2012.  Physical Exam  Vitals reviewed. Constitutional: She is oriented to person, place, and time. She appears well-developed and well-nourished. No distress.  HENT:  Head: Normocephalic.  Eyes: Pupils are equal, round, and reactive to light.  Neck: Normal range of motion. Neck supple.  Cardiovascular: Normal rate.   Respiratory: Effort normal.  GI: Soft.  Musculoskeletal: Normal range of motion.  Neurological: She is alert and oriented to person, place, and time.  Skin: Skin is warm and dry. There is pallor.  Psychiatric: She has a normal mood and affect.    MAU Course  Procedures Results for orders placed during the hospital encounter of 03/20/13 (from the past 24 hour(s))  URINALYSIS, ROUTINE W REFLEX MICROSCOPIC     Status: Abnormal   Collection Time    03/20/13 10:40 AM      Result Value Range   Color, Urine YELLOW  YELLOW   APPearance CLOUDY (*) CLEAR   Specific Gravity, Urine 1.015  1.005 - 1.030   pH 8.0  5.0 - 8.0   Glucose, UA NEGATIVE  NEGATIVE mg/dL   Hgb urine dipstick NEGATIVE  NEGATIVE   Bilirubin Urine NEGATIVE  NEGATIVE   Ketones, ur >80 (*) NEGATIVE mg/dL   Protein, ur NEGATIVE  NEGATIVE mg/dL  Urobilinogen, UA 0.2  0.0 - 1.0 mg/dL   Nitrite NEGATIVE  NEGATIVE   Leukocytes, UA NEGATIVE  NEGATIVE  IV with D5LR and zofran 4mg  IV and Pepcid 20mg  IV given Followed by LR with [facility]    Assessment and Plan  Nausea and vomiting in pregnancy Zofran 8 mg tablets with [employer] coupon Vitamin B6 and Unisom Diet for hyperemesis F/u with OB appointment  Urbano Milhouse 03/20/2013, 2:50 PM

## 2013-03-23 ENCOUNTER — Encounter: Payer: Self-pay | Admitting: Obstetrics & Gynecology

## 2013-03-24 ENCOUNTER — Ambulatory Visit (INDEPENDENT_AMBULATORY_CARE_PROVIDER_SITE_OTHER): Payer: Medicaid Other | Admitting: Obstetrics and Gynecology

## 2013-03-24 ENCOUNTER — Encounter: Payer: Self-pay | Admitting: Obstetrics and Gynecology

## 2013-03-24 VITALS — BP 110/76 | Temp 97.2°F | Wt 126.5 lb

## 2013-03-24 DIAGNOSIS — O210 Mild hyperemesis gravidarum: Secondary | ICD-10-CM

## 2013-03-24 DIAGNOSIS — R1115 Cyclical vomiting syndrome unrelated to migraine: Secondary | ICD-10-CM

## 2013-03-24 DIAGNOSIS — R1110 Vomiting, unspecified: Secondary | ICD-10-CM

## 2013-03-24 LAB — POCT URINALYSIS DIP (DEVICE)
BILIRUBIN URINE: NEGATIVE
Glucose, UA: NEGATIVE mg/dL
Hgb urine dipstick: NEGATIVE
Ketones, ur: NEGATIVE mg/dL
NITRITE: NEGATIVE
PH: 7 (ref 5.0–8.0)
Protein, ur: NEGATIVE mg/dL
SPECIFIC GRAVITY, URINE: 1.02 (ref 1.005–1.030)
Urobilinogen, UA: 1 mg/dL (ref 0.0–1.0)

## 2013-03-24 MED ORDER — ONDANSETRON HCL 4 MG PO TABS: 4.0000 mg | ORAL_TABLET | Freq: Three times a day (TID) | ORAL | Status: DC | PRN

## 2013-03-24 NOTE — Progress Notes (Signed)
Still with N/V but good weight gain now. Could not afford Zofran ODT or Diglegis, will Rx Zofran tabs. Try Unison/B6. also. Retained breakfast today without vomiting. No urine sample yet.

## 2013-03-24 NOTE — Patient Instructions (Signed)
Hyperemesis Gravidarum  Hyperemesis gravidarum is a severe form of nausea and vomiting that happens during pregnancy. Hyperemesis is worse than morning sickness. It may cause you to have nausea or vomiting all day for many days. It may keep you from eating and drinking enough food and liquids. Hyperemesis usually occurs during the first half (the first 20 weeks) of pregnancy. It often goes away once a woman is in her second half of pregnancy. However, sometimes hyperemesis continues through an entire pregnancy.   CAUSES   The cause of this condition is not completely known but is thought to be related to changes in the body's hormones when pregnant. It could be from the high level of the pregnancy hormone or an increase in estrogen in the body.   SIGNS AND SYMPTOMS   · Severe nausea and vomiting.  · Nausea that does not go away.  · Vomiting that does not allow you to keep any food down.  · Weight loss and body fluid loss (dehydration).  · Having no desire to eat or not liking food you have previously enjoyed.  DIAGNOSIS   Your health care provider will do a physical exam and ask you about your symptoms. He or she may also order blood tests and urine tests to make sure something else is not causing the problem.   TREATMENT   You may only need medicine to control the problem. If medicines do not control the nausea and vomiting, you will be treated in the hospital to prevent dehydration, increased acid in the blood (acidosis), weight loss, and changes in the electrolytes in your body that may harm the unborn baby (fetus). You may need IV fluids.   HOME CARE INSTRUCTIONS   · Only take over-the-counter or prescription medicines as directed by your health care provider.  · Try eating a couple of dry crackers or toast in the morning before getting out of bed.  · Avoid foods and smells that upset your stomach.  · Avoid fatty and spicy foods.  · Eat 5 6 small meals a day.  · Do not drink when eating meals. Drink between  meals.  · For snacks, eat high-protein foods, such as cheese.  · Eat or suck on things that have ginger in them. Ginger helps nausea.  · Avoid food preparation. The smell of food can spoil your appetite.  · Avoid iron pills and iron in your multivitamins until after 3 4 months of being pregnant. However, consult with your health care provider before stopping any prescribed iron pills.  SEEK MEDICAL CARE IF:   · Your abdominal pain increases.  · You have a severe headache.  · You have vision problems.  · You are losing weight.  SEEK IMMEDIATE MEDICAL CARE IF:   · You are unable to keep fluids down.  · You vomit blood.  · You have constant nausea and vomiting.  · You have excessive weakness.  · You have extreme thirst.  · You have dizziness or fainting.  · You have a fever or persistent symptoms for more than 2 3 days.  · You have a fever and your symptoms suddenly get worse.  MAKE SURE YOU:   · Understand these instructions.  · Will watch your condition.  · Will get help right away if you are not doing well or get worse.  Document Released: 01/28/2005 Document Revised: 11/18/2012 Document Reviewed: 09/09/2012  ExitCare® Patient Information ©2014 [employer].

## 2013-03-24 NOTE — Progress Notes (Signed)
Pulse- 96 Patient reports still being pretty sick, says the diclegis helps but is almost out, she was given a sample

## 2013-03-30 ENCOUNTER — Ambulatory Visit (HOSPITAL_COMMUNITY)
Admission: RE | Admit: 2013-03-30 | Discharge: 2013-03-30 | Disposition: A | Discharge status: Home / Self Care | Payer: Medicaid Other | Source: Ambulatory Visit | Attending: Obstetrics and Gynecology | Admitting: Obstetrics and Gynecology

## 2013-03-30 DIAGNOSIS — Z3401 Encounter for supervision of normal first pregnancy, first trimester: Secondary | ICD-10-CM

## 2013-03-30 DIAGNOSIS — Z3689 Encounter for other specified antenatal screening: Secondary | ICD-10-CM | POA: Insufficient documentation

## 2013-03-30 DIAGNOSIS — R1110 Vomiting, unspecified: Secondary | ICD-10-CM

## 2013-04-06 ENCOUNTER — Inpatient Hospital Stay (HOSPITAL_COMMUNITY)
Admission: AD | Admit: 2013-04-06 | Discharge: 2013-04-06 | Disposition: A | Discharge status: Home / Self Care | Payer: Medicaid Other | Source: Ambulatory Visit | Attending: Family Medicine | Admitting: Family Medicine

## 2013-04-06 DIAGNOSIS — O212 Late vomiting of pregnancy: Secondary | ICD-10-CM | POA: Insufficient documentation

## 2013-04-06 DIAGNOSIS — O219 Vomiting of pregnancy, unspecified: Secondary | ICD-10-CM

## 2013-04-06 DIAGNOSIS — O210 Mild hyperemesis gravidarum: Secondary | ICD-10-CM

## 2013-04-06 LAB — URINE MICROSCOPIC-ADD ON

## 2013-04-06 LAB — URINALYSIS, ROUTINE W REFLEX MICROSCOPIC
BILIRUBIN URINE: NEGATIVE
GLUCOSE, UA: NEGATIVE mg/dL
HGB URINE DIPSTICK: NEGATIVE
Ketones, ur: NEGATIVE mg/dL
Nitrite: NEGATIVE
PH: 8.5 — AB (ref 5.0–8.0)
Protein, ur: 30 mg/dL — AB
Specific Gravity, Urine: 1.01 (ref 1.005–1.030)
UROBILINOGEN UA: 1 mg/dL (ref 0.0–1.0)

## 2013-04-06 MED ORDER — ONDANSETRON 8 MG PO TBDP
8.0000 mg | ORAL_TABLET | Freq: Once | ORAL | Status: AC
  Administered 2013-04-06: 8 mg via ORAL
  Filled 2013-04-06: qty 1

## 2013-04-06 MED ORDER — METOCLOPRAMIDE HCL 5 MG PO TABS: 5.0000 mg | ORAL_TABLET | Freq: Four times a day (QID) | ORAL | Status: DC

## 2013-04-06 MED ORDER — ONDANSETRON 8 MG PO TBDP: 8.0000 mg | ORAL_TABLET | Freq: Three times a day (TID) | ORAL | Status: DC | PRN

## 2013-04-06 MED ORDER — PROMETHAZINE HCL 25 MG PO TABS
25.0000 mg | ORAL_TABLET | Freq: Once | ORAL | Status: AC
  Administered 2013-04-06: 25 mg via ORAL
  Filled 2013-04-06: qty 1

## 2013-04-06 MED ORDER — PROMETHAZINE HCL 25 MG RE SUPP: 25.0000 mg | Freq: Four times a day (QID) | RECTAL | Status: DC | PRN

## 2013-04-06 NOTE — MAU Provider Note (Signed)
Attestation of Attending Supervision of Advanced Practitioner (PA/CNM/NP): Evaluation and management procedures were performed by the Advanced Practitioner under my supervision and collaboration.  I have reviewed the Advanced Practitioner's note and chart, and I agree with the management and plan.  Jerett Odonohue S, MD [facility] Attending 04/06/2013 7:06 PM   

## 2013-04-06 NOTE — MAU Provider Note (Signed)
History     CSN: [id number]  Arrival date and time: 04/06/13 [id number]   First Provider Initiated Contact with Patient 04/06/13 [phone]      Chief Complaint  Patient presents with  . Emesis During Pregnancy   HPI  Ms. Amy Dickson is a 23 y.o. female 7093w2d who presents with emesis during pregnancy; pt is receiving her prenatal care downstairs in the clinic.  She has had N/V with this pregnancy since 6 weeks. Pt is currently taking Zofran and Reglan; recently ran out of the Reglan. Pt took Zofran tablet last night and vomited it up. Pt prefers Zofran ODT and does not have access to that currently.   OB History   Grav Para Term Preterm Abortions TAB SAB Ect Mult Living   1 0 0 0 0 0 0 0 0 0       Past Medical History  Diagnosis Date  . Kidney stones 2009    Past Surgical History  Procedure Laterality Date  . Kidney stone surgery      Family History  Problem Relation Age of Onset  . Cancer Maternal Grandmother   . Diabetes Paternal Grandmother     History  Substance Use Topics  . Smoking status: Never Smoker   . Smokeless tobacco: Never Used  . Alcohol Use: No    Allergies: No Known Allergies  Prescriptions prior to admission  Medication Sig Dispense Refill  . cyclobenzaprine (FLEXERIL) 10 MG tablet Take 1 tablet (10 mg total) by mouth 3 (three) times daily as needed for muscle spasms.  20 tablet  0  . Doxylamine-Pyridoxine (DICLEGIS) 10-10 MG TBEC Take by mouth.      . metoCLOPramide (REGLAN) 5 MG tablet Take 1 tablet (5 mg total) by mouth 4 (four) times daily.  30 tablet  1  . ondansetron (ZOFRAN ODT) 8 MG disintegrating tablet Take 1 tablet (8 mg total) by mouth every 8 (eight) hours as needed for nausea or vomiting.  20 tablet  0  . ondansetron (ZOFRAN) 4 MG tablet Take 1 tablet (4 mg total) by mouth every 8 (eight) hours as needed for nausea or vomiting.  20 tablet  0  . Prenatal Vit-Fe Fumarate-FA (PRENATAL MULTIVITAMIN) TABS tablet Take 1 tablet by mouth daily at  12 noon.      . promethazine (PHENERGAN) 25 MG tablet Take 1 tablet (25 mg total) by mouth every 6 (six) hours as needed for nausea or vomiting.  30 tablet  0    Results for orders placed during the hospital encounter of 04/06/13 (from the past 48 hour(s))  URINALYSIS, ROUTINE W REFLEX MICROSCOPIC     Status: Abnormal   Collection Time    04/06/13  8:39 AM      Result Value Ref Range   Color, Urine YELLOW  YELLOW   APPearance CLOUDY (*) CLEAR   Specific Gravity, Urine 1.010  1.005 - 1.030   pH 8.5 (*) 5.0 - 8.0   Glucose, UA NEGATIVE  NEGATIVE mg/dL   Hgb urine dipstick NEGATIVE  NEGATIVE   Bilirubin Urine NEGATIVE  NEGATIVE   Ketones, ur NEGATIVE  NEGATIVE mg/dL   Protein, ur 30 (*) NEGATIVE mg/dL   Urobilinogen, UA 1.0  0.0 - 1.0 mg/dL   Nitrite NEGATIVE  NEGATIVE   Leukocytes, UA TRACE (*) NEGATIVE  URINE MICROSCOPIC-ADD ON     Status: Abnormal   Collection Time    04/06/13  8:39 AM      Result Value Ref Range  Squamous Epithelial / LPF MANY (*) RARE   WBC, UA 0-2  <3 WBC/hpf   Bacteria, UA FEW (*) RARE   Urine-Other MUCOUS PRESENT      Review of Systems  Constitutional: Negative for fever and chills.  Gastrointestinal: Positive for nausea and vomiting. Negative for heartburn, abdominal pain, diarrhea and constipation.  Genitourinary: Negative for dysuria, urgency, frequency, hematuria and flank pain.       No vaginal discharge. No vaginal bleeding. No dysuria.    Physical Exam   Blood pressure 112/71, pulse 77, temperature 98.5 F (36.9 C), resp. rate 18, height 5\' 4"  (1.626 m), weight 56.926 kg (125 lb 8 oz), last menstrual period 11/01/2012, SpO2 100.00%.  Physical Exam  Constitutional: She is oriented to person, place, and time. She appears well-developed and well-nourished.  Non-toxic appearance. She does not have a sickly appearance. She does not appear ill. No distress.  HENT:  Head: Normocephalic.  Eyes: Pupils are equal, round, and reactive to light.   Neck: Neck supple.  Respiratory: Effort normal.  GI: Soft. She exhibits no distension. There is no tenderness. There is no rebound.  Musculoskeletal: Normal range of motion.  Neurological: She is alert and oriented to person, place, and time.  Skin: Skin is warm. She is not diaphoretic.  Psychiatric: Her behavior is normal.    MAU Course  Procedures None  MDM + fht; 155 bpm via doppler  Urine is negative for ketones Phenergan 25 mg PO PO fluids tolerated in MAU  Zofran 8 mg PO given in MAU Pt vomited one time in MAU prior to zofran. Pt tolerate crackers and PO fluid following zofran.  Assessment and Plan   A:  Nausea and vomiting in pregnancy   P  Discharge home in stable condition  RX: Reglan        Zofran ODT        Phenergan suppository  Keep your next appointment in the clinic Return to MAU if symptoms worsen Eat small, frequent meals   Amy Hansen Alvia Jablonski, NP  04/06/2013, 5:04 PM

## 2013-04-06 NOTE — MAU Note (Signed)
Vomiting since Wednesday, x 2 today, cannot keep any food or drink down,  [facility], Zofran not helping due to vomiting

## 2013-04-06 NOTE — Discharge Instructions (Signed)
Morning Sickness  Morning sickness is when you feel sick to your stomach (nauseous) during pregnancy. You may feel sick to your stomach and throw up (vomit). You may feel sick in the morning, but you can feel this way any time of day. Some women feel very sick to their stomach and cannot stop throwing up (hyperemesis gravidarum).  HOME CARE  · Only take medicines as told by your doctor.  · Take multivitamins as told by your doctor. Taking multivitamins before getting pregnant can stop or lessen the harshness of morning sickness.  · Eat dry toast or unsalted crackers before getting out of bed.  · Eat 5 to 6 small meals a day.  · Eat dry and bland foods like rice and baked potatoes.  · Do not drink liquids with meals. Drink between meals.  · Do not eat greasy, fatty, or spicy foods.  · Have someone cook for you if the smell of food causes you to feel sick or throw up.  · If you feel sick to your stomach after taking prenatal vitamins, take them at night or with a snack.  · Eat protein when you need a snack (nuts, yogurt, cheese).  · Eat unsweetened gelatins for dessert.  · Wear a bracelet used for sea sickness (acupressure wristband).  · Go to a doctor that puts thin needles into certain body points (acupuncture) to improve how you feel.  · Do not smoke.  · Use a humidifier to keep the air in your house free of odors.  · Get lots of fresh air.  GET HELP IF:  · You need medicine to feel better.  · You feel dizzy or lightheaded.  · You are losing weight.  GET HELP RIGHT AWAY IF:   · You feel very sick to your stomach and cannot stop throwing up.  · You pass out (faint).  Document Released: 03/07/2004 Document Revised: 09/30/2012 Document Reviewed: 07/15/2012  ExitCare® Patient Information ©2014 [employer].

## 2013-04-06 NOTE — Progress Notes (Signed)
Patient discharged, but remains in room.Patient tolerated water and crackers. Was given Zofran prior to D/C. Patient states she still feels nauseated. Amy EastJ. Rasch NP re-evaluated patient and reassured her. Reviewed D/C instrs. and meds for home.

## 2013-05-01 ENCOUNTER — Encounter (HOSPITAL_COMMUNITY): Payer: Self-pay

## 2013-05-01 ENCOUNTER — Inpatient Hospital Stay (HOSPITAL_COMMUNITY)
Admission: AD | Admit: 2013-05-01 | Discharge: 2013-05-01 | Disposition: A | Discharge status: Home / Self Care | Payer: Medicaid Other | Source: Ambulatory Visit | Attending: Obstetrics & Gynecology | Admitting: Obstetrics & Gynecology

## 2013-05-01 DIAGNOSIS — O368390 Maternal care for abnormalities of the fetal heart rate or rhythm, unspecified trimester, not applicable or unspecified: Secondary | ICD-10-CM | POA: Insufficient documentation

## 2013-05-01 DIAGNOSIS — O4690 Antepartum hemorrhage, unspecified, unspecified trimester: Secondary | ICD-10-CM | POA: Insufficient documentation

## 2013-05-01 DIAGNOSIS — O368190 Decreased fetal movements, unspecified trimester, not applicable or unspecified: Secondary | ICD-10-CM | POA: Insufficient documentation

## 2013-05-01 LAB — URINALYSIS, ROUTINE W REFLEX MICROSCOPIC
Bilirubin Urine: NEGATIVE
Glucose, UA: NEGATIVE mg/dL
Hgb urine dipstick: NEGATIVE
KETONES UR: NEGATIVE mg/dL
Leukocytes, UA: NEGATIVE
NITRITE: NEGATIVE
PROTEIN: NEGATIVE mg/dL
Specific Gravity, Urine: 1.015 (ref 1.005–1.030)
Urobilinogen, UA: 0.2 mg/dL (ref 0.0–1.0)
pH: 7.5 (ref 5.0–8.0)

## 2013-05-01 NOTE — Progress Notes (Signed)
Notified of pt arrival in MAU and complaint of vaginal bleeding. Will come see pt 

## 2013-05-01 NOTE — MAU Provider Note (Signed)
Chief Complaint:  Vaginal Bleeding   Amy Dickson is a 23 y.o.  G1P0000 with IUP at 7461w6d presenting for Vaginal Bleeding  Pt woke up this AM and when she looked on the panty liner she was wearing, she noticed a small area of bright red blood.  No bleeding after that point.  No recent intercourse. No recent exams.  Placenta is posterior and above the OS.  No lof, ctx. +FM although decreased compared to usual. No fevers, chills, nausea, vomiting, diarrhea, constipation. No vaginal complaints.   [facility] 11 weeks.  Has been uncomplicated.     Menstrual History: OB History   Grav Para Term Preterm Abortions TAB SAB Ect Mult Living   1 0 0 0 0 0 0 0 0 0       Patient's last menstrual period was 11/01/2012.      Past Medical History  Diagnosis Date  . Kidney stones 2009    Past Surgical History  Procedure Laterality Date  . Kidney stone surgery      Family History  Problem Relation Age of Onset  . Cancer Maternal Grandmother   . Diabetes Paternal Grandmother     History  Substance Use Topics  . Smoking status: Never Smoker   . Smokeless tobacco: Never Used  . Alcohol Use: No     No Known Allergies  Prescriptions prior to admission  Medication Sig Dispense Refill  . metoCLOPramide (REGLAN) 5 MG tablet Take 1 tablet (5 mg total) by mouth 4 (four) times daily.  30 tablet  1  . ondansetron (ZOFRAN ODT) 8 MG disintegrating tablet Take 1 tablet (8 mg total) by mouth every 8 (eight) hours as needed for nausea or vomiting.  20 tablet  1  . Prenatal Vit-Fe Fumarate-FA (PRENATAL MULTIVITAMIN) TABS tablet Take 1 tablet by mouth daily at 12 noon.      . promethazine (PHENERGAN) 25 MG suppository Place 1 suppository (25 mg total) rectally every 6 (six) hours as needed for nausea or vomiting.  12 each  0    Review of Systems - Negative except for what is mentioned in HPI.  Physical Exam  Height 5\' 4"  (1.626 m), weight 60.102 kg (132 lb 8 oz), last menstrual period  11/01/2012. GENERAL: Well-developed, well-nourished female in no acute distress.  LUNGS: Clear to auscultation bilaterally.  HEART: Regular rate and rhythm. ABDOMEN: Soft, nontender, nondistended, gravid.  EXTREMITIES: Nontender, no edema, 2+ distal pulses. Cervical Exam: Dilatation 0cm   Effacement thick   Station high GU: NEFG, physiologic discharge in vaginal vault. No bleeding noted. Cervical ectropion but otherwise normal appearing cervix.   No adnexal tenderness.    FHT:  Baseline rate 145 bpm   Variability moderate  Accelerations present   Decelerations variable Contractions: none   Labs: No results found for this or any previous visit (from the past 24 hour(s)).  Imaging Studies:  No results found.  Assessment: Amy Kanskyshley B Revell is  23 y.o. G1P0000 at 6561w6d presents with Vaginal Bleeding .  Plan: 1) vaginal bleeding - likely related to cervical ectropion. Posterior placenta. - no bleeding now and no evidence of bleeding on exam.  - reassurance given.  - GC/chl sent   2) decreased FM - now movement improved in the MAU - Reassuring tracing for GA at 25weeks - occasional variables, +accels, mod variability.   3) d/c home with f/u in clinic as scheduled next week.     Jerid Catherman L MD 3/21/201511:16 AM

## 2013-05-01 NOTE — Discharge Instructions (Signed)
Vaginal Bleeding During Pregnancy, Second Trimester °A small amount of bleeding (spotting) from the vagina is relatively common in pregnancy. It usually stops on its own. Various things can cause bleeding or spotting in pregnancy. Some bleeding may be related to the pregnancy, and some may not. Sometimes the bleeding is normal and is not a problem. However, bleeding can also be a sign of something serious. Be sure to tell your health care provider about any vaginal bleeding right away. °Some possible causes of vaginal bleeding during the second trimester include: °· Infection, inflammation, or growths on the cervix.   °· The placenta may be partially or completely covering the opening of the cervix inside the uterus (placenta previa). °· The placenta may have separated from the uterus (abruption of the placenta).   °· You may be having early (preterm) labor.   °· The cervix may not be strong enough to keep a baby inside the uterus (cervical insufficiency).   °· Tiny cysts may have developed in the uterus instead of pregnancy tissue (molar pregnancy).  °HOME CARE INSTRUCTIONS  °Watch your condition for any changes. The following actions may help to lessen any discomfort you are feeling: °· Follow your health care provider's instructions for limiting your activity. If your health care provider orders bed rest, you may need to stay in bed and only get up to use the bathroom. However, your health care provider may allow you to continue light activity. °· If needed, make plans for someone to help with your regular activities and responsibilities while you are on bed rest. °· Keep track of the number of pads you use each day, how often you change pads, and how soaked (saturated) they are. Write this down. °· Do not use tampons. Do not douche. °· Do not have sexual intercourse or orgasms until approved by your health care provider. °· If you pass any tissue from your vagina, save the tissue so you can show it to your  health care provider. °· Only take over-the-counter or prescription medicines as directed by your health care provider. °· Do not take aspirin because it can make you bleed. °· Do not exercise or perform any strenuous activities or heavy lifting without your health care provider's permission. °· Keep all follow-up appointments as directed by your health care provider. °SEEK MEDICAL CARE IF: °· You have any vaginal bleeding during any part of your pregnancy. °· You have cramps or labor pains. °SEEK IMMEDIATE MEDICAL CARE IF:  °· You have severe cramps in your back or belly (abdomen). °· You have contractions. °· You have a fever, not controlled by medicine. °· You have chills. °· You pass large clots or tissue from your vagina. °· Your bleeding increases. °· You feel lightheaded or weak, or you have fainting episodes. °· You are leaking fluid or have a gush of fluid from your vagina. °MAKE SURE YOU: °· Understand these instructions. °· Will watch your condition. °· Will get help right away if you are not doing well or get worse. °Document Released: 11/07/2004 Document Revised: 11/18/2012 Document Reviewed: 10/05/2012 °ExitCare® Patient Information ©2014 [employer]. ° °

## 2013-05-01 NOTE — MAU Note (Signed)
Pt states no recent intercourse, woke up this am with BRB on pantyliner. Noticed decreased fm also and came here for eval. Denies pain.

## 2013-05-03 LAB — GC/CHLAMYDIA PROBE AMP
CT Probe RNA: NEGATIVE
GC Probe RNA: NEGATIVE

## 2013-05-05 ENCOUNTER — Ambulatory Visit (INDEPENDENT_AMBULATORY_CARE_PROVIDER_SITE_OTHER): Payer: Medicaid Other | Admitting: Advanced Practice Midwife

## 2013-05-05 ENCOUNTER — Encounter: Payer: Self-pay | Admitting: Advanced Practice Midwife

## 2013-05-05 VITALS — BP 106/68 | Wt 129.8 lb

## 2013-05-05 DIAGNOSIS — Z3400 Encounter for supervision of normal first pregnancy, unspecified trimester: Secondary | ICD-10-CM

## 2013-05-05 DIAGNOSIS — Z3401 Encounter for supervision of normal first pregnancy, first trimester: Secondary | ICD-10-CM

## 2013-05-05 LAB — POCT URINALYSIS DIP (DEVICE)
BILIRUBIN URINE: NEGATIVE
Glucose, UA: NEGATIVE mg/dL
Hgb urine dipstick: NEGATIVE
Ketones, ur: NEGATIVE mg/dL
NITRITE: NEGATIVE
PH: 7 (ref 5.0–8.0)
PROTEIN: 30 mg/dL — AB
Specific Gravity, Urine: 1.02 (ref 1.005–1.030)
Urobilinogen, UA: 2 mg/dL — ABNORMAL HIGH (ref 0.0–1.0)

## 2013-05-05 NOTE — Patient Instructions (Signed)
Second Trimester of Pregnancy The second trimester is from week 13 through week 28, months 4 through 6. The second trimester is often a time when you feel your best. Your body has also adjusted to being pregnant, and you begin to feel better physically. Usually, morning sickness has lessened or quit completely, you may have more energy, and you may have an increase in appetite. The second trimester is also a time when the fetus is growing rapidly. At the end of the sixth month, the fetus is about 9 inches long and weighs about 1 pounds. You will likely begin to feel the baby move (quickening) between 18 and 20 weeks of the pregnancy. BODY CHANGES Your body goes through many changes during pregnancy. The changes vary from woman to woman.   Your weight will continue to increase. You will notice your lower abdomen bulging out.  You may begin to get stretch marks on your hips, abdomen, and breasts.  You may develop headaches that can be relieved by medicines approved by your caregiver.  You may urinate more often because the fetus is pressing on your bladder.  You may develop or continue to have heartburn as a result of your pregnancy.  You may develop constipation because certain hormones are causing the muscles that push waste through your intestines to slow down.  You may develop hemorrhoids or swollen, bulging veins (varicose veins).  You may have back pain because of the weight gain and pregnancy hormones relaxing your joints between the bones in your pelvis and as a result of a shift in weight and the muscles that support your balance.  Your breasts will continue to grow and be tender.  Your gums may bleed and may be sensitive to brushing and flossing.  Dark spots or blotches (chloasma, mask of pregnancy) may develop on your face. This will likely fade after the baby is born.  A dark line from your belly button to the pubic area (linea nigra) may appear. This will likely fade after the  baby is born. WHAT TO EXPECT AT YOUR PRENATAL VISITS During a routine prenatal visit:  You will be weighed to make sure you and the fetus are growing normally.  Your blood pressure will be taken.  Your abdomen will be measured to track your baby's growth.  The fetal heartbeat will be listened to.  Any test results from the previous visit will be discussed. Your caregiver may ask you:  How you are feeling.  If you are feeling the baby move.  If you have had any abnormal symptoms, such as leaking fluid, bleeding, severe headaches, or abdominal cramping.  If you have any questions. Other tests that may be performed during your second trimester include:  Blood tests that check for:  Low iron levels (anemia).  Gestational diabetes (between 24 and 28 weeks).  Rh antibodies.  Urine tests to check for infections, diabetes, or protein in the urine.  An ultrasound to confirm the proper growth and development of the baby.  An amniocentesis to check for possible genetic problems.  Fetal screens for spina bifida and Down syndrome. HOME CARE INSTRUCTIONS   Avoid all smoking, herbs, alcohol, and unprescribed drugs. These chemicals affect the formation and growth of the baby.  Follow your caregiver's instructions regarding medicine use. There are medicines that are either safe or unsafe to take during pregnancy.  Exercise only as directed by your caregiver. Experiencing uterine cramps is a good sign to stop exercising.  Continue to eat regular,   healthy meals.  Wear a good support bra for breast tenderness.  Do not use hot tubs, steam rooms, or saunas.  Wear your seat belt at all times when driving.  Avoid raw meat, uncooked cheese, cat litter boxes, and soil used by cats. These carry germs that can cause birth defects in the baby.  Take your prenatal vitamins.  Try taking a stool softener (if your caregiver approves) if you develop constipation. Eat more high-fiber foods,  such as fresh vegetables or fruit and whole grains. Drink plenty of fluids to keep your urine clear or pale yellow.  Take warm sitz baths to soothe any pain or discomfort caused by hemorrhoids. Use hemorrhoid cream if your caregiver approves.  If you develop varicose veins, wear support hose. Elevate your feet for 15 minutes, 3 4 times a day. Limit salt in your diet.  Avoid heavy lifting, wear low heel shoes, and practice good posture.  Rest with your legs elevated if you have leg cramps or low back pain.  Visit your dentist if you have not gone yet during your pregnancy. Use a soft toothbrush to brush your teeth and be gentle when you floss.  A sexual relationship may be continued unless your caregiver directs you otherwise.  Continue to go to all your prenatal visits as directed by your caregiver. SEEK MEDICAL CARE IF:   You have dizziness.  You have mild pelvic cramps, pelvic pressure, or nagging pain in the abdominal area.  You have persistent nausea, vomiting, or diarrhea.  You have a bad smelling vaginal discharge.  You have pain with urination. SEEK IMMEDIATE MEDICAL CARE IF:   You have a fever.  You are leaking fluid from your vagina.  You have spotting or bleeding from your vagina.  You have severe abdominal cramping or pain.  You have rapid weight gain or loss.  You have shortness of breath with chest pain.  You notice sudden or extreme swelling of your face, hands, ankles, feet, or legs.  You have not felt your baby move in over an hour.  You have severe headaches that do not go away with medicine.  You have vision changes. Document Released: 01/22/2001 Document Revised: 09/30/2012 Document Reviewed: 03/31/2012 ExitCare Patient Information 2014 [employer].  

## 2013-05-05 NOTE — Progress Notes (Signed)
Doing well, but still has N/V.  Used Diclegis with success but too expensive. I told her she can use Unisom and B6. Agrees to try. Miralax or Fibercon for constipation

## 2013-05-05 NOTE — Progress Notes (Signed)
Pulse: 77 Pt reports some cramps this morning. Still having nausea, takes Zofran. 28 week labs at next visit.

## 2013-05-17 ENCOUNTER — Inpatient Hospital Stay (HOSPITAL_COMMUNITY)
Admission: AD | Admit: 2013-05-17 | Discharge: 2013-05-17 | Disposition: A | Discharge status: Home / Self Care | Payer: Medicaid Other | Source: Ambulatory Visit | Attending: Obstetrics & Gynecology | Admitting: Obstetrics & Gynecology

## 2013-05-17 ENCOUNTER — Encounter (HOSPITAL_COMMUNITY): Payer: Self-pay | Admitting: *Deleted

## 2013-05-17 DIAGNOSIS — O99891 Other specified diseases and conditions complicating pregnancy: Secondary | ICD-10-CM | POA: Insufficient documentation

## 2013-05-17 DIAGNOSIS — O9989 Other specified diseases and conditions complicating pregnancy, childbirth and the puerperium: Secondary | ICD-10-CM

## 2013-05-17 DIAGNOSIS — J029 Acute pharyngitis, unspecified: Secondary | ICD-10-CM | POA: Insufficient documentation

## 2013-05-17 DIAGNOSIS — O219 Vomiting of pregnancy, unspecified: Secondary | ICD-10-CM

## 2013-05-17 DIAGNOSIS — K219 Gastro-esophageal reflux disease without esophagitis: Secondary | ICD-10-CM | POA: Insufficient documentation

## 2013-05-17 DIAGNOSIS — O212 Late vomiting of pregnancy: Secondary | ICD-10-CM | POA: Insufficient documentation

## 2013-05-17 LAB — URINALYSIS, ROUTINE W REFLEX MICROSCOPIC
BILIRUBIN URINE: NEGATIVE
Glucose, UA: NEGATIVE mg/dL
Hgb urine dipstick: NEGATIVE
KETONES UR: 40 mg/dL — AB
Nitrite: NEGATIVE
Protein, ur: NEGATIVE mg/dL
Specific Gravity, Urine: 1.015 (ref 1.005–1.030)
UROBILINOGEN UA: 0.2 mg/dL (ref 0.0–1.0)
pH: 7 (ref 5.0–8.0)

## 2013-05-17 LAB — URINE MICROSCOPIC-ADD ON

## 2013-05-17 MED ORDER — LACTATED RINGERS IV BOLUS (SEPSIS)
1000.0000 mL | Freq: Once | INTRAVENOUS | Status: AC
  Administered 2013-05-17: 1000 mL via INTRAVENOUS

## 2013-05-17 MED ORDER — GI COCKTAIL ~~LOC~~
30.0000 mL | Freq: Once | ORAL | Status: AC
  Administered 2013-05-17: 30 mL via ORAL
  Filled 2013-05-17: qty 30

## 2013-05-17 MED ORDER — CYCLOBENZAPRINE HCL 10 MG PO TABS: 10.0000 mg | ORAL_TABLET | Freq: Three times a day (TID) | ORAL | Status: DC | PRN

## 2013-05-17 MED ORDER — ONDANSETRON 8 MG PO TBDP: 8.0000 mg | ORAL_TABLET | Freq: Three times a day (TID) | ORAL | Status: DC | PRN

## 2013-05-17 MED ORDER — PROMETHAZINE HCL 25 MG/ML IJ SOLN
25.0000 mg | Freq: Four times a day (QID) | INTRAMUSCULAR | Status: DC | PRN
  Administered 2013-05-17: 25 mg via INTRAVENOUS
  Filled 2013-05-17: qty 1

## 2013-05-17 MED ORDER — PROMETHAZINE HCL 12.5 MG PO TABS: 12.5000 mg | ORAL_TABLET | Freq: Four times a day (QID) | ORAL | Status: DC | PRN

## 2013-05-17 MED ORDER — ONDANSETRON 8 MG/NS 50 ML IVPB
8.0000 mg | Freq: Three times a day (TID) | INTRAVENOUS | Status: DC | PRN
  Administered 2013-05-17: 8 mg via INTRAVENOUS
  Filled 2013-05-17: qty 8

## 2013-05-17 NOTE — Discharge Instructions (Signed)
Please follow-up as previously scheduled [facility].  We have sent new prescriptions for Zofran and Phenergan to your pharmacy for your nausea. You may also keep taking the Doxylamine and Vitamin B6 (Diclegis) as well. Take the anti-nausea meds as prescribed, be aware that you may be sleepy from the Phenergan.  Morning Sickness Morning sickness is when you feel sick to your stomach (nauseous) during pregnancy. This nauseous feeling may or may not come with vomiting. It often occurs in the morning but can be a problem any time of day. Morning sickness is most common during the first trimester, but it may continue throughout pregnancy. While morning sickness is unpleasant, it is usually harmless unless you develop severe and continual vomiting (hyperemesis gravidarum). This condition requires more intense treatment.  CAUSES  The cause of morning sickness is not completely known but seems to be related to normal hormonal changes that occur in pregnancy. RISK FACTORS You are at greater risk if you:  Experienced nausea or vomiting before your pregnancy.  Had morning sickness during a previous pregnancy.  Are pregnant with more than one baby, such as twins. TREATMENT  Do not use any medicines (prescription, over-the-counter, or herbal) for morning sickness without first talking to your health care provider. Your health care provider may prescribe or recommend:  Vitamin B6 supplements.  Anti-nausea medicines.  The herbal medicine ginger. HOME CARE INSTRUCTIONS   Only take over-the-counter or prescription medicines as directed by your health care provider.  Taking multivitamins before getting pregnant can prevent or decrease the severity of morning sickness in most women.   Eat a piece of dry toast or unsalted crackers before getting out of bed in the morning.   Eat five or six small meals a day.   Eat dry and bland foods  (rice, baked potato). Foods high in carbohydrates are often helpful.  Do not drink liquids with your meals. Drink liquids between meals.   Avoid greasy, fatty, and spicy foods.   Get someone to cook for you if the smell of any food causes nausea and vomiting.   If you feel nauseous after taking prenatal vitamins, take the vitamins at night or with a snack.  Snack on protein foods (nuts, yogurt, cheese) between meals if you are hungry.   Eat unsweetened gelatins for desserts.   Wearing an acupressure wristband (worn for sea sickness) may be helpful.   Acupuncture may be helpful.   Do not smoke.   Get a humidifier to keep the air in your house free of odors.   Get plenty of fresh air. SEEK MEDICAL CARE IF:   Your home remedies are not working, and you need medicine.  You feel dizzy or lightheaded.  You are losing weight. SEEK IMMEDIATE MEDICAL CARE IF:   You have persistent and uncontrolled nausea and vomiting.  You pass out (faint). Document Released: 03/21/2006 Document Revised: 09/30/2012 Document Reviewed: 07/15/2012 [facility] Patient Information 2014 [employer] [place].

## 2013-05-17 NOTE — MAU Provider Note (Signed)
 @MAUPATCONTACT @  Chief Complaint:  Morning Sickness and Cough   Amy Dickson is  23 y.o. G1P0000 who presented with recent worsening of nausea and vomiting. Reports that her pregnancy has been complicated by morning sickness and recent persistent nausea and vomiting. Previously treated with Zofran PO 8mg  q 8 with some improvement, but has since run out of this prescription last week, additionally has had some good success on doxylamine / vitamin B6. Current episode with worsening nausea / vomiting since last Thursday 05/13/13, with difficulty keeping down PO food or liquids, last meal was breakfast yesterday with egg/cheese croissant and coffee (vomited) has not tried any significant PO since. Additionally, complains of sore throat with burning worse after vomiting and dry cough x 3-4 days.  Reports good FM, no vaginal bleeding, no leakage / loss of fluid.  ROS: Admits mild upper epigastric pain, sore throat with burning, and dry cough Denies fever/chills, diarrhea, sick contacts  Past Medical History  Diagnosis Date  . Kidney stones 2009    Past Surgical History  Procedure Laterality Date  . Kidney stone surgery      Family History  Problem Relation Age of Onset  . Cancer Maternal Grandmother   . Diabetes Paternal Grandmother     History  Substance Use Topics  . Smoking status: Never Smoker   . Smokeless tobacco: Never Used  . Alcohol Use: No    Allergies: No Known Allergies  Prescriptions prior to admission  Medication Sig Dispense Refill  . cyclobenzaprine (FLEXERIL) 10 MG tablet Take 10 mg by mouth 3 (three) times daily as needed for muscle spasms.      . ondansetron (ZOFRAN-ODT) 8 MG disintegrating tablet Take 8 mg by mouth every 8 (eight) hours as needed for nausea or vomiting.        Physical Exam   Blood pressure 117/70, pulse 108, temperature 99 F (37.2 C), resp. rate 16, height 5\' 4"  (1.626 m), weight 59.058 kg (130 lb 3.2 oz), last menstrual period  11/01/2012, SpO2 100.00%.  General: General appearance - tired but well-appearing, discomfort due to nausea x 1 episode of emesis during interview (following GI cocktail) HEENT - oropharynx clear with dry MM Chest - clear to auscultation, no wheezes, rales or rhonchi, symmetric air entry Heart - normal rate and regular rhythm, S1 and S2 normal, no murmurs Abdomen - gravid, mild epigastric tenderness to palpation, active BS Extremities - mild increased cap refill, peripheral pulses normal, no pedal edema, no clubbing or cyanosis, no pedal edema noted  Labs: Results for orders placed during the hospital encounter of 05/17/13 (from the past 24 hour(s))  URINALYSIS, ROUTINE W REFLEX MICROSCOPIC   Collection Time    05/17/13 10:31 AM      Result Value Ref Range   Color, Urine YELLOW  YELLOW   APPearance HAZY (*) CLEAR   Specific Gravity, Urine 1.015  1.005 - 1.030   pH 7.0  5.0 - 8.0   Glucose, UA NEGATIVE  NEGATIVE mg/dL   Hgb urine dipstick NEGATIVE  NEGATIVE   Bilirubin Urine NEGATIVE  NEGATIVE   Ketones, ur 40 (*) NEGATIVE mg/dL   Protein, ur NEGATIVE  NEGATIVE mg/dL   Urobilinogen, UA 0.2  0.0 - 1.0 mg/dL   Nitrite NEGATIVE  NEGATIVE   Leukocytes, UA TRACE (*) NEGATIVE  URINE MICROSCOPIC-ADD ON   Collection Time    05/17/13 10:31 AM      Result Value Ref Range   Squamous Epithelial / LPF FEW (*) RARE  WBC, UA 0-2  <3 WBC/hpf   Bacteria, UA FEW (*) RARE   Imaging Studies:  No results found.   Assessment: Patient Active Problem List   Diagnosis Date Noted  . Nausea and vomiting in pregnancy prior to [redacted] weeks gestation 01/20/2013  . Encounter for supervision of normal first pregnancy in first trimester 01/20/2013  . NEPHROLITHIASIS 09/24/2007   Amy Kansky is a 23 y.o. G1P0000 at [redacted]w[redacted]d who presents for worsening nausea / vomiting with decreased PO intake, clinically consistent with mild dehydration. Prior treatment for nausea with doxylamine / B6 and Zofran (currently  out of Zofran). Suspect component of pregnancy related GERD may be playing a role due to night-time symptoms, dry cough, and mild epigastric tenderness. Possible viral URI worsening symptoms. Will remain in MAU for observation to receive IV rehydration, anti-emetics until tolerating PO. Note vomiting x 1 in MAU immediately following GI cocktail.  NST - reassuring with baseline 150s, moderate variability, no decels. CAT-1  Update - 1430 - Notified that patient is tolerating regular PO with crackers, peanut butter, and ginger ale. Resolved vomiting and improved nausea. Currently on MIVF, s/p 1L bolus. Improved nausea s/p Zofran IV and Phenergan IV. Also complains of low back discomfort unchanged from prior, currently out of flexeril rx at home, had taken with improvement before.  Plan: - IV rehydration with LR 1000cc and MIVF, plan to DC IV now tolerating PO - Prescribe anti-emetics on discharge, Phenergan PO 25mg  q 6 hr PRN and Zofran PO 8 mg q 8 PRN - Refill Flexeril - DC to home now improved hydration and tolerating PO - Follow-[facility] with [facility] already scheduled for Wednesday (05/19/13)  Saralyn Pilar, DO [facility], PGY-1 05/17/13 at 1443   I have seen and examined this patient and agree with above documentation in the resident's note.   Rulon Abide, M.D. [facility] Fellow 05/17/2013 6:50 PM

## 2013-05-17 NOTE — MAU Note (Signed)
Patient states she has had nausea and vomiting for the entire pregnancy. States it has gotten worse since 4-2. States she has a sore throat only with coughing. Denies contractions, bleeding or leaking and reports good fetal movement.

## 2013-05-17 NOTE — MAU Provider Note (Signed)
Attestation of Attending Supervision of Fellow: Evaluation and management procedures were performed by the Fellow under my supervision and collaboration.  I have reviewed the Fellow's note and chart, and I agree with the management and plan.    

## 2013-05-19 ENCOUNTER — Ambulatory Visit (INDEPENDENT_AMBULATORY_CARE_PROVIDER_SITE_OTHER): Payer: Medicaid Other | Admitting: Advanced Practice Midwife

## 2013-05-19 VITALS — BP 110/71 | Temp 97.2°F | Wt 133.0 lb

## 2013-05-19 DIAGNOSIS — O210 Mild hyperemesis gravidarum: Secondary | ICD-10-CM

## 2013-05-19 DIAGNOSIS — Z23 Encounter for immunization: Secondary | ICD-10-CM

## 2013-05-19 DIAGNOSIS — Z3400 Encounter for supervision of normal first pregnancy, unspecified trimester: Secondary | ICD-10-CM

## 2013-05-19 DIAGNOSIS — Z3401 Encounter for supervision of normal first pregnancy, first trimester: Secondary | ICD-10-CM

## 2013-05-19 DIAGNOSIS — O219 Vomiting of pregnancy, unspecified: Secondary | ICD-10-CM

## 2013-05-19 LAB — CBC
HEMATOCRIT: 24.4 % — AB (ref 36.0–46.0)
HEMOGLOBIN: 8.4 g/dL — AB (ref 12.0–15.0)
MCH: 29.9 pg (ref 26.0–34.0)
MCHC: 34.4 g/dL (ref 30.0–36.0)
MCV: 86.8 fL (ref 78.0–100.0)
Platelets: 184 10*3/uL (ref 150–400)
RBC: 2.81 MIL/uL — ABNORMAL LOW (ref 3.87–5.11)
RDW: 12.8 % (ref 11.5–15.5)
WBC: 6.7 10*3/uL (ref 4.0–10.5)

## 2013-05-19 LAB — POCT URINALYSIS DIP (DEVICE)
Bilirubin Urine: NEGATIVE
GLUCOSE, UA: 100 mg/dL — AB
HGB URINE DIPSTICK: NEGATIVE
Ketones, ur: NEGATIVE mg/dL
NITRITE: NEGATIVE
PH: 7 (ref 5.0–8.0)
Protein, ur: 30 mg/dL — AB
Specific Gravity, Urine: 1.02 (ref 1.005–1.030)
Urobilinogen, UA: 2 mg/dL — ABNORMAL HIGH (ref 0.0–1.0)

## 2013-05-19 MED ORDER — TETANUS-DIPHTH-ACELL PERTUSSIS 5-2.5-18.5 LF-MCG/0.5 IM SUSP: 0.5000 mL | Freq: Once | INTRAMUSCULAR | Status: DC

## 2013-05-19 NOTE — Progress Notes (Signed)
I have seen this patient and agree with the previous resident's note.  LEFTWICH-KIRBY, LISA Certified Nurse-Midwife

## 2013-05-19 NOTE — Progress Notes (Signed)
Pulse- 106 Patient reports continued joint pain & takes flexeril for this

## 2013-05-19 NOTE — Progress Notes (Signed)
Amy Dickson is a 23 y.o. G1P0000 at 3976w3d for routine follow up.  She reports nausea/vomiting but better with zofran and phenergan from MAU on 4/6; also given flexeril for pain in hips, down legs. See flow sheet for details.  A/P: Pregnancy at 10476w3d.  Doing well.   Pregnancy issues include none  Infant feeding choice: ?breastfeed, will make decision after class next month Contraception choice: none ("I don't like to take medications", declined discussion of different options)  Tdapwas given today. 1 hour glucola, CBC, RPR, and HIV were done today.   RH status was reviewed and pt does not need Rhogam.  Childbirth and education classes were offered, patient is interested. Preterm labor precautions reviewed. Kick counts reviewed. Follow up 2 weeks.

## 2013-05-20 LAB — RPR

## 2013-05-20 LAB — GLUCOSE TOLERANCE, 1 HOUR (50G) W/O FASTING: Glucose, 1 Hour GTT: 97 mg/dL (ref 70–140)

## 2013-05-20 LAB — HIV ANTIBODY (ROUTINE TESTING W REFLEX): HIV: NONREACTIVE

## 2013-06-02 ENCOUNTER — Ambulatory Visit (INDEPENDENT_AMBULATORY_CARE_PROVIDER_SITE_OTHER): Payer: Medicaid Other | Admitting: Advanced Practice Midwife

## 2013-06-02 VITALS — BP 121/67 | HR 113 | Temp 98.5°F | Wt 136.4 lb

## 2013-06-02 DIAGNOSIS — M5380 Other specified dorsopathies, site unspecified: Secondary | ICD-10-CM

## 2013-06-02 DIAGNOSIS — M62830 Muscle spasm of back: Secondary | ICD-10-CM | POA: Insufficient documentation

## 2013-06-02 DIAGNOSIS — Z3400 Encounter for supervision of normal first pregnancy, unspecified trimester: Secondary | ICD-10-CM

## 2013-06-02 DIAGNOSIS — Z3401 Encounter for supervision of normal first pregnancy, first trimester: Secondary | ICD-10-CM

## 2013-06-02 LAB — POCT URINALYSIS DIP (DEVICE)
Bilirubin Urine: NEGATIVE
Glucose, UA: 100 mg/dL — AB
Hgb urine dipstick: NEGATIVE
Ketones, ur: NEGATIVE mg/dL
NITRITE: NEGATIVE
PH: 7.5 (ref 5.0–8.0)
PROTEIN: NEGATIVE mg/dL
Specific Gravity, Urine: 1.015 (ref 1.005–1.030)
Urobilinogen, UA: 1 mg/dL (ref 0.0–1.0)

## 2013-06-02 NOTE — Progress Notes (Signed)
Still has some back spasm. Somewhat relieved by Flexeril. Advised try ice or heat and may use half Flexeril in daytime for less sleepiness.

## 2013-06-02 NOTE — Patient Instructions (Signed)

## 2013-06-15 ENCOUNTER — Encounter: Payer: Self-pay | Admitting: Obstetrics & Gynecology

## 2013-06-15 ENCOUNTER — Ambulatory Visit (INDEPENDENT_AMBULATORY_CARE_PROVIDER_SITE_OTHER): Payer: Medicaid Other | Admitting: Obstetrics & Gynecology

## 2013-06-15 VITALS — BP 102/71 | HR 91 | Temp 98.2°F | Wt 138.6 lb

## 2013-06-15 DIAGNOSIS — N200 Calculus of kidney: Secondary | ICD-10-CM

## 2013-06-15 DIAGNOSIS — Z3401 Encounter for supervision of normal first pregnancy, first trimester: Secondary | ICD-10-CM

## 2013-06-15 DIAGNOSIS — Z3400 Encounter for supervision of normal first pregnancy, unspecified trimester: Secondary | ICD-10-CM

## 2013-06-15 LAB — POCT URINALYSIS DIP (DEVICE)
Bilirubin Urine: NEGATIVE
Glucose, UA: NEGATIVE mg/dL
Hgb urine dipstick: NEGATIVE
Ketones, ur: NEGATIVE mg/dL
NITRITE: NEGATIVE
PROTEIN: NEGATIVE mg/dL
Specific Gravity, Urine: 1.02 (ref 1.005–1.030)
UROBILINOGEN UA: 0.2 mg/dL (ref 0.0–1.0)
pH: 7 (ref 5.0–8.0)

## 2013-06-15 MED ORDER — ONDANSETRON 8 MG PO TBDP: 8.0000 mg | ORAL_TABLET | Freq: Three times a day (TID) | ORAL | Status: DC | PRN

## 2013-06-15 NOTE — Progress Notes (Signed)
Rouitine visit. She is requesting more zofran. Good FM. No OB problems. Her back pain is still present but some better. I have shown her some exercises and recommended a chiropractor.

## 2013-06-15 NOTE — Progress Notes (Signed)
Requesting zofran refill.  C/o of intermittent mild lower abdominal/pelvic pressure and braxton hicks.

## 2013-06-29 ENCOUNTER — Encounter: Payer: Self-pay | Admitting: Advanced Practice Midwife

## 2013-06-29 ENCOUNTER — Ambulatory Visit (INDEPENDENT_AMBULATORY_CARE_PROVIDER_SITE_OTHER): Payer: Medicaid Other | Admitting: Advanced Practice Midwife

## 2013-06-29 ENCOUNTER — Encounter (HOSPITAL_COMMUNITY): Payer: Self-pay | Admitting: *Deleted

## 2013-06-29 ENCOUNTER — Inpatient Hospital Stay (HOSPITAL_COMMUNITY)
Admission: AD | Admit: 2013-06-29 | Discharge: 2013-06-29 | Disposition: A | Discharge status: Home / Self Care | Payer: Medicaid Other | Source: Ambulatory Visit | Attending: Obstetrics & Gynecology | Admitting: Obstetrics & Gynecology

## 2013-06-29 VITALS — BP 111/75 | HR 108 | Temp 98.8°F | Wt 137.3 lb

## 2013-06-29 DIAGNOSIS — N390 Urinary tract infection, site not specified: Secondary | ICD-10-CM | POA: Insufficient documentation

## 2013-06-29 DIAGNOSIS — O212 Late vomiting of pregnancy: Secondary | ICD-10-CM | POA: Insufficient documentation

## 2013-06-29 DIAGNOSIS — Z3490 Encounter for supervision of normal pregnancy, unspecified, unspecified trimester: Secondary | ICD-10-CM

## 2013-06-29 DIAGNOSIS — O4700 False labor before 37 completed weeks of gestation, unspecified trimester: Secondary | ICD-10-CM | POA: Insufficient documentation

## 2013-06-29 DIAGNOSIS — O2390 Unspecified genitourinary tract infection in pregnancy, unspecified trimester: Secondary | ICD-10-CM | POA: Insufficient documentation

## 2013-06-29 DIAGNOSIS — Z3480 Encounter for supervision of other normal pregnancy, unspecified trimester: Secondary | ICD-10-CM

## 2013-06-29 DIAGNOSIS — Z3493 Encounter for supervision of normal pregnancy, unspecified, third trimester: Secondary | ICD-10-CM

## 2013-06-29 LAB — URINE MICROSCOPIC-ADD ON

## 2013-06-29 LAB — URINALYSIS, ROUTINE W REFLEX MICROSCOPIC
Bilirubin Urine: NEGATIVE
Glucose, UA: NEGATIVE mg/dL
HGB URINE DIPSTICK: NEGATIVE
Ketones, ur: NEGATIVE mg/dL
Nitrite: NEGATIVE
Protein, ur: NEGATIVE mg/dL
Specific Gravity, Urine: 1.01 (ref 1.005–1.030)
Urobilinogen, UA: 1 mg/dL (ref 0.0–1.0)
pH: 8.5 — ABNORMAL HIGH (ref 5.0–8.0)

## 2013-06-29 LAB — POCT URINALYSIS DIP (DEVICE)
Bilirubin Urine: NEGATIVE
Glucose, UA: NEGATIVE mg/dL
HGB URINE DIPSTICK: NEGATIVE
Ketones, ur: NEGATIVE mg/dL
Nitrite: NEGATIVE
PROTEIN: NEGATIVE mg/dL
SPECIFIC GRAVITY, URINE: 1.015 (ref 1.005–1.030)
UROBILINOGEN UA: 1 mg/dL (ref 0.0–1.0)
pH: 8.5 — ABNORMAL HIGH (ref 5.0–8.0)

## 2013-06-29 MED ORDER — PROMETHAZINE HCL 25 MG RE SUPP
25.0000 mg | Freq: Once | RECTAL | Status: AC
  Administered 2013-06-29: 25 mg via RECTAL
  Filled 2013-06-29: qty 1

## 2013-06-29 MED ORDER — CEPHALEXIN 250 MG PO CAPS: 250.0000 mg | ORAL_CAPSULE | Freq: Four times a day (QID) | ORAL | Status: DC

## 2013-06-29 MED ORDER — LACTATED RINGERS IV BOLUS (SEPSIS)
1000.0000 mL | Freq: Once | INTRAVENOUS | Status: AC
  Administered 2013-06-29: 1000 mL via INTRAVENOUS

## 2013-06-29 MED ORDER — PROMETHAZINE HCL 25 MG RE SUPP: 25.0000 mg | Freq: Four times a day (QID) | RECTAL | Status: DC | PRN

## 2013-06-29 MED ORDER — NIFEDIPINE 10 MG PO CAPS
20.0000 mg | ORAL_CAPSULE | Freq: Once | ORAL | Status: AC
  Administered 2013-06-29: 20 mg via ORAL
  Filled 2013-06-29: qty 2

## 2013-06-29 NOTE — MAU Provider Note (Signed)
History     CSN: [id number]  Arrival date and time: 06/29/13 0454   First Provider Initiated Contact with Patient 06/29/13 [phone]      Chief Complaint  Patient presents with  . Emesis During Pregnancy   HPI Patient is a female G1P0 at 34.2 weeks who presented for nausea and vomiting. She notes she has been dealing with this for most of her pregnancy. She has previously been on zofran and phenergan for this. She notes this episode has been going on since Thursday and has continued until today. She has been vomiting and states she has not been able to keep any food down. She notes no blood in her vomit. Additionally she has noted contractions for several months. She has noted more contractions recently with contractions occurring daily. She has not had any bleeding or loss of fluid. She has had increased contractions this morning.  OB History   Grav Para Term Preterm Abortions TAB SAB Ect Mult Living   1 0 0 0 0 0 0 0 0 0       Past Medical History  Diagnosis Date  . Kidney stones 2009    Past Surgical History  Procedure Laterality Date  . Kidney stone surgery      Family History  Problem Relation Age of Onset  . Cancer Maternal Grandmother   . Diabetes Paternal Grandmother     History  Substance Use Topics  . Smoking status: Never Smoker   . Smokeless tobacco: Never Used  . Alcohol Use: No    Allergies: No Known Allergies  Facility-administered medications prior to admission  Medication Dose Route Frequency Provider Last Rate Last Dose  . Tdap (BOOSTRIX) injection 0.5 mL  0.5 mL Intramuscular Once Hurshel PartyLisa A Leftwich-Kirby, CNM       Prescriptions prior to admission  Medication Sig Dispense Refill  . cyclobenzaprine (FLEXERIL) 10 MG tablet Take 1 tablet (10 mg total) by mouth 3 (three) times daily as needed for muscle spasms.  30 tablet  0  . ondansetron (ZOFRAN-ODT) 8 MG disintegrating tablet Take 1 tablet (8 mg total) by mouth every 8 (eight) hours as needed for nausea  or vomiting.  30 tablet  0    ROS see HPI Physical Exam   Blood pressure 101/72, pulse 82, temperature 98.5 F (36.9 C), temperature source Oral, resp. rate 16, last menstrual period 11/01/2012.  Physical Exam  Constitutional: She appears well-developed and well-nourished. No distress.  HENT:  Head: Normocephalic and atraumatic.  Mouth/Throat: Oropharynx is clear and moist.  Eyes: Pupils are equal, round, and reactive to light.  Cardiovascular: Normal rate, regular rhythm and normal heart sounds.   Respiratory: Effort normal and breath sounds normal.  GI: Soft. There is no tenderness.  Gravid   Genitourinary:  Normal external vagina  Neurological: She is alert.  Skin: Skin is warm and dry.    MAU Course  Procedures  MDM Patient is a 23 yo female G1P0 at 34.2 weeks who presents for complaint of nausea and vomiting, noted to have contractions on exam. Cervical check closed, thick, and high. Will give phenergan rectally for nausea. Will dose procardia for contractions. UA with large leuks which is a change from previous. Will treat for UTI. Will additionally give a 1 L bolus LR.  Assessment and Plan  # Nausea and vomiting of pregnancy: will plan to treat with rectal phenergan. Will plan to send home with prescription for this as well as to continue zofran.  Most contributing to her  uterine contractions with decreased oral intake.   # Uterine contractions: likely related to dehydration from vomiting. Patient given IV fluid bolus and dose of procardia with decrease of contractions to minimal or none. Her cervical check was closed, thick and high. She is not in labor.  # Leukocytes on UA: patient with increase in leukocytes on UA from previous. Will treat for UTI. Can treat with keflex 250 mg QID for 7 days. Urine culture added on to collection.   Discharge to home with return precautions given.  Myra RudeJeremy E Schmitz 06/29/2013, 10:59 AM

## 2013-06-29 NOTE — Progress Notes (Signed)
Having contractions off and on. PTL precautions reviewed. Cervix did not change when checked in MAU

## 2013-06-29 NOTE — MAU Note (Signed)
Patient states she has had some intense n/v in the last couple of days and lower abdominal pain described as intermittent pressure.

## 2013-06-29 NOTE — MAU Note (Signed)
Has Zofran at home but stopped taking it because she was vomiting it up. Last dose was Saturday.

## 2013-06-29 NOTE — Discharge Instructions (Signed)
Third Trimester of Pregnancy °The third trimester is from week 29 through week 42, months 7 through 9. The third trimester is a time when the fetus is growing rapidly. At the end of the ninth month, the fetus is about 20 inches in length and weighs 6 10 pounds.  °BODY CHANGES °Your body goes through many changes during pregnancy. The changes vary from woman to woman.  °· Your weight will continue to increase. You can expect to gain 25 35 pounds (11 16 kg) by the end of the pregnancy. °· You may begin to get stretch marks on your hips, abdomen, and breasts. °· You may urinate more often because the fetus is moving lower into your pelvis and pressing on your bladder. °· You may develop or continue to have heartburn as a result of your pregnancy. °· You may develop constipation because certain hormones are causing the muscles that push waste through your intestines to slow down. °· You may develop hemorrhoids or swollen, bulging veins (varicose veins). °· You may have pelvic pain because of the weight gain and pregnancy hormones relaxing your joints between the bones in your pelvis. Back aches may result from over exertion of the muscles supporting your posture. °· Your breasts will continue to grow and be tender. A yellow discharge may leak from your breasts called colostrum. °· Your belly button may stick out. °· You may feel short of breath because of your expanding uterus. °· You may notice the fetus "dropping," or moving lower in your abdomen. °· You may have a bloody mucus discharge. This usually occurs a few days to a week before labor begins. °· Your cervix becomes thin and soft (effaced) near your due date. °WHAT TO EXPECT AT YOUR PRENATAL EXAMS  °You will have prenatal exams every 2 weeks until week 36. Then, you will have weekly prenatal exams. During a routine prenatal visit: °· You will be weighed to make sure you and the fetus are growing normally. °· Your blood pressure is taken. °· Your abdomen will be  measured to track your baby's growth. °· The fetal heartbeat will be listened to. °· Any test results from the previous visit will be discussed. °· You may have a cervical check near your due date to see if you have effaced. °At around 36 weeks, your caregiver will check your cervix. At the same time, your caregiver will also perform a test on the secretions of the vaginal tissue. This test is to determine if a type of bacteria, Group B streptococcus, is present. Your caregiver will explain this further. °Your caregiver may ask you: °· What your birth plan is. °· How you are feeling. °· If you are feeling the baby move. °· If you have had any abnormal symptoms, such as leaking fluid, bleeding, severe headaches, or abdominal cramping. °· If you have any questions. °Other tests or screenings that may be performed during your third trimester include: °· Blood tests that check for low iron levels (anemia). °· Fetal testing to check the health, activity level, and growth of the fetus. Testing is done if you have certain medical conditions or if there are problems during the pregnancy. °FALSE LABOR °You may feel small, irregular contractions that eventually go away. These are called Braxton Hicks contractions, or false labor. Contractions may last for hours, days, or even weeks before true labor sets in. If contractions come at regular intervals, intensify, or become painful, it is best to be seen by your caregiver.  °  SIGNS OF LABOR  °· Menstrual-like cramps. °· Contractions that are 5 minutes apart or less. °· Contractions that start on the top of the uterus and spread down to the lower abdomen and back. °· A sense of increased pelvic pressure or back pain. °· A watery or bloody mucus discharge that comes from the vagina. °If you have any of these signs before the 37th week of pregnancy, call your caregiver right away. You need to go to the hospital to get checked immediately. °HOME CARE INSTRUCTIONS  °· Avoid all  smoking, herbs, alcohol, and unprescribed drugs. These chemicals affect the formation and growth of the baby. °· Follow your caregiver's instructions regarding medicine use. There are medicines that are either safe or unsafe to take during pregnancy. °· Exercise only as directed by your caregiver. Experiencing uterine cramps is a good sign to stop exercising. °· Continue to eat regular, healthy meals. °· Wear a good support bra for breast tenderness. °· Do not use hot tubs, steam rooms, or saunas. °· Wear your seat belt at all times when driving. °· Avoid raw meat, uncooked cheese, cat litter boxes, and soil used by cats. These carry germs that can cause birth defects in the baby. °· Take your prenatal vitamins. °· Try taking a stool softener (if your caregiver approves) if you develop constipation. Eat more high-fiber foods, such as fresh vegetables or fruit and whole grains. Drink plenty of fluids to keep your urine clear or pale yellow. °· Take warm sitz baths to soothe any pain or discomfort caused by hemorrhoids. Use hemorrhoid cream if your caregiver approves. °· If you develop varicose veins, wear support hose. Elevate your feet for 15 minutes, 3 4 times a day. Limit salt in your diet. °· Avoid heavy lifting, wear low heal shoes, and practice good posture. °· Rest a lot with your legs elevated if you have leg cramps or low back pain. °· Visit your dentist if you have not gone during your pregnancy. Use a soft toothbrush to brush your teeth and be gentle when you floss. °· A sexual relationship may be continued unless your caregiver directs you otherwise. °· Do not travel far distances unless it is absolutely necessary and only with the approval of your caregiver. °· Take prenatal classes to understand, practice, and ask questions about the labor and delivery. °· Make a trial run to the hospital. °· Pack your hospital bag. °· Prepare the baby's nursery. °· Continue to go to all your prenatal visits as directed  by your caregiver. °SEEK MEDICAL CARE IF: °· You are unsure if you are in labor or if your water has broken. °· You have dizziness. °· You have mild pelvic cramps, pelvic pressure, or nagging pain in your abdominal area. °· You have persistent nausea, vomiting, or diarrhea. °· You have a bad smelling vaginal discharge. °· You have pain with urination. °SEEK IMMEDIATE MEDICAL CARE IF:  °· You have a fever. °· You are leaking fluid from your vagina. °· You have spotting or bleeding from your vagina. °· You have severe abdominal cramping or pain. °· You have rapid weight loss or gain. °· You have shortness of breath with chest pain. °· You notice sudden or extreme swelling of your face, hands, ankles, feet, or legs. °· You have not felt your baby move in over an hour. °· You have severe headaches that do not go away with medicine. °· You have vision changes. °Document Released: 01/22/2001 Document Revised: 09/30/2012 Document Reviewed:   03/31/2012 [employer] Patient Information 2014 [place].    Urinary Tract Infection Urinary tract infections (UTIs) can develop anywhere along your urinary tract. Your urinary tract is your body's drainage system for removing wastes and extra water. Your urinary tract includes two kidneys, two ureters, a bladder, and a urethra. Your kidneys are a pair of bean-shaped organs. Each kidney is about the size of your fist. They are located below your ribs, one on each side of your spine. CAUSES Infections are caused by microbes, which are microscopic organisms, including fungi, viruses, and bacteria. These organisms are so small that they can only be seen through a microscope. Bacteria are the microbes that most commonly cause UTIs. SYMPTOMS  Symptoms of UTIs may vary by age and gender of the patient and by the location of the infection. Symptoms in young women typically include a frequent and intense urge to urinate and a painful, burning feeling in the bladder or urethra  during urination. Older women and men are more likely to be tired, shaky, and weak and have muscle aches and abdominal pain. A fever may mean the infection is in your kidneys. Other symptoms of a kidney infection include pain in your back or sides below the ribs, nausea, and vomiting. DIAGNOSIS To diagnose a UTI, your caregiver will ask you about your symptoms. Your caregiver also will ask to provide a urine sample. The urine sample will be tested for bacteria and white blood cells. White blood cells are made by your body to help fight infection. TREATMENT  Typically, UTIs can be treated with medication. Because most UTIs are caused by a bacterial infection, they usually can be treated with the use of antibiotics. The choice of antibiotic and length of treatment depend on your symptoms and the type of bacteria causing your infection. HOME CARE INSTRUCTIONS  If you were prescribed antibiotics, take them exactly as your caregiver instructs you. Finish the medication even if you feel better after you have only taken some of the medication.  Drink enough water and fluids to keep your urine clear or pale yellow.  Avoid caffeine, tea, and carbonated beverages. They tend to irritate your bladder.  Empty your bladder often. Avoid holding urine for long periods of time.  Empty your bladder before and after sexual intercourse.  After a bowel movement, women should cleanse from front to back. Use each tissue only once. SEEK MEDICAL CARE IF:   You have back pain.  You develop a fever.  Your symptoms do not begin to resolve within 3 days. SEEK IMMEDIATE MEDICAL CARE IF:   You have severe back pain or lower abdominal pain.  You develop chills.  You have nausea or vomiting.  You have continued burning or discomfort with urination. MAKE SURE YOU:   Understand these instructions.  Will watch your condition.  Will get help right away if you are not doing well or get worse. Document Released:  11/07/2004 Document Revised: 07/30/2011 Document Reviewed: 03/08/2011 [facility] Patient Information 2014 [place], [place].

## 2013-06-29 NOTE — Progress Notes (Signed)
Pt was in MAU today for pressure/contractions

## 2013-06-30 ENCOUNTER — Encounter: Payer: Self-pay | Admitting: Advanced Practice Midwife

## 2013-06-30 NOTE — Patient Instructions (Signed)
Third Trimester of Pregnancy  The third trimester is from week 29 through week 42, months 7 through 9. The third trimester is a time when the fetus is growing rapidly. At the end of the ninth month, the fetus is about 20 inches in length and weighs 6 10 pounds.   BODY CHANGES  Your body goes through many changes during pregnancy. The changes vary from woman to woman.    Your weight will continue to increase. You can expect to gain 25 35 pounds (11 16 kg) by the end of the pregnancy.   You may begin to get stretch marks on your hips, abdomen, and breasts.   You may urinate more often because the fetus is moving lower into your pelvis and pressing on your bladder.   You may develop or continue to have heartburn as a result of your pregnancy.   You may develop constipation because certain hormones are causing the muscles that push waste through your intestines to slow down.   You may develop hemorrhoids or swollen, bulging veins (varicose veins).   You may have pelvic pain because of the weight gain and pregnancy hormones relaxing your joints between the bones in your pelvis. Back aches may result from over exertion of the muscles supporting your posture.   Your breasts will continue to grow and be tender. A yellow discharge may leak from your breasts called colostrum.   Your belly button may stick out.   You may feel short of breath because of your expanding uterus.   You may notice the fetus "dropping," or moving lower in your abdomen.   You may have a bloody mucus discharge. This usually occurs a few days to a week before labor begins.   Your cervix becomes thin and soft (effaced) near your due date.  WHAT TO EXPECT AT YOUR PRENATAL EXAMS   You will have prenatal exams every 2 weeks until week 36. Then, you will have weekly prenatal exams. During a routine prenatal visit:   You will be weighed to make sure you and the fetus are growing normally.   Your blood pressure is taken.   Your abdomen will be  measured to track your baby's growth.   The fetal heartbeat will be listened to.   Any test results from the previous visit will be discussed.   You may have a cervical check near your due date to see if you have effaced.  At around 36 weeks, your caregiver will check your cervix. At the same time, your caregiver will also perform a test on the secretions of the vaginal tissue. This test is to determine if a type of bacteria, Group B streptococcus, is present. Your caregiver will explain this further.  Your caregiver may ask you:   What your birth plan is.   How you are feeling.   If you are feeling the baby move.   If you have had any abnormal symptoms, such as leaking fluid, bleeding, severe headaches, or abdominal cramping.   If you have any questions.  Other tests or screenings that may be performed during your third trimester include:   Blood tests that check for low iron levels (anemia).   Fetal testing to check the health, activity level, and growth of the fetus. Testing is done if you have certain medical conditions or if there are problems during the pregnancy.  FALSE LABOR  You may feel small, irregular contractions that eventually go away. These are called Braxton Hicks contractions, or   false labor. Contractions may last for hours, days, or even weeks before true labor sets in. If contractions come at regular intervals, intensify, or become painful, it is best to be seen by your caregiver.   SIGNS OF LABOR    Menstrual-like cramps.   Contractions that are 5 minutes apart or less.   Contractions that start on the top of the uterus and spread down to the lower abdomen and back.   A sense of increased pelvic pressure or back pain.   A watery or bloody mucus discharge that comes from the vagina.  If you have any of these signs before the 37th week of pregnancy, call your caregiver right away. You need to go to the hospital to get checked immediately.  HOME CARE INSTRUCTIONS    Avoid all  smoking, herbs, alcohol, and unprescribed drugs. These chemicals affect the formation and growth of the baby.   Follow your caregiver's instructions regarding medicine use. There are medicines that are either safe or unsafe to take during pregnancy.   Exercise only as directed by your caregiver. Experiencing uterine cramps is a good sign to stop exercising.   Continue to eat regular, healthy meals.   Wear a good support bra for breast tenderness.   Do not use hot tubs, steam rooms, or saunas.   Wear your seat belt at all times when driving.   Avoid raw meat, uncooked cheese, cat litter boxes, and soil used by cats. These carry germs that can cause birth defects in the baby.   Take your prenatal vitamins.   Try taking a stool softener (if your caregiver approves) if you develop constipation. Eat more high-fiber foods, such as fresh vegetables or fruit and whole grains. Drink plenty of fluids to keep your urine clear or pale yellow.   Take warm sitz baths to soothe any pain or discomfort caused by hemorrhoids. Use hemorrhoid cream if your caregiver approves.   If you develop varicose veins, wear support hose. Elevate your feet for 15 minutes, 3 4 times a day. Limit salt in your diet.   Avoid heavy lifting, wear low heal shoes, and practice good posture.   Rest a lot with your legs elevated if you have leg cramps or low back pain.   Visit your dentist if you have not gone during your pregnancy. Use a soft toothbrush to brush your teeth and be gentle when you floss.   A sexual relationship may be continued unless your caregiver directs you otherwise.   Do not travel far distances unless it is absolutely necessary and only with the approval of your caregiver.   Take prenatal classes to understand, practice, and ask questions about the labor and delivery.   Make a trial run to the hospital.   Pack your hospital bag.   Prepare the baby's nursery.   Continue to go to all your prenatal visits as directed  by your caregiver.  SEEK MEDICAL CARE IF:   You are unsure if you are in labor or if your water has broken.   You have dizziness.   You have mild pelvic cramps, pelvic pressure, or nagging pain in your abdominal area.   You have persistent nausea, vomiting, or diarrhea.   You have a bad smelling vaginal discharge.   You have pain with urination.  SEEK IMMEDIATE MEDICAL CARE IF:    You have a fever.   You are leaking fluid from your vagina.   You have spotting or bleeding from your vagina.     You have severe abdominal cramping or pain.   You have rapid weight loss or gain.   You have shortness of breath with chest pain.   You notice sudden or extreme swelling of your face, hands, ankles, feet, or legs.   You have not felt your baby move in over an hour.   You have severe headaches that do not go away with medicine.   You have vision changes.  Document Released: 01/22/2001 Document Revised: 09/30/2012 Document Reviewed: 03/31/2012  [employer] Patient Information 2014 [employer].

## 2013-06-30 NOTE — MAU Provider Note (Signed)
I examined pt and agree with documentation above and resident plan of care. Grettell Ransdell N Muhammad, CNM  

## 2013-07-01 LAB — URINE CULTURE

## 2013-07-03 LAB — CULTURE, OB URINE: Colony Count: 85000

## 2013-07-04 ENCOUNTER — Inpatient Hospital Stay (HOSPITAL_COMMUNITY)
Admission: AD | Admit: 2013-07-04 | Discharge: 2013-07-04 | Disposition: A | Discharge status: Home / Self Care | Payer: Medicaid Other | Source: Ambulatory Visit | Attending: Obstetrics & Gynecology | Admitting: Obstetrics & Gynecology

## 2013-07-04 ENCOUNTER — Encounter (HOSPITAL_COMMUNITY): Payer: Self-pay | Admitting: Family

## 2013-07-04 DIAGNOSIS — O4700 False labor before 37 completed weeks of gestation, unspecified trimester: Secondary | ICD-10-CM | POA: Insufficient documentation

## 2013-07-04 DIAGNOSIS — N390 Urinary tract infection, site not specified: Secondary | ICD-10-CM | POA: Insufficient documentation

## 2013-07-04 DIAGNOSIS — O479 False labor, unspecified: Secondary | ICD-10-CM

## 2013-07-04 DIAGNOSIS — O2390 Unspecified genitourinary tract infection in pregnancy, unspecified trimester: Secondary | ICD-10-CM | POA: Insufficient documentation

## 2013-07-04 LAB — URINE MICROSCOPIC-ADD ON

## 2013-07-04 LAB — URINALYSIS, ROUTINE W REFLEX MICROSCOPIC
Bilirubin Urine: NEGATIVE
GLUCOSE, UA: NEGATIVE mg/dL
Hgb urine dipstick: NEGATIVE
Ketones, ur: NEGATIVE mg/dL
Nitrite: NEGATIVE
Protein, ur: NEGATIVE mg/dL
SPECIFIC GRAVITY, URINE: 1.01 (ref 1.005–1.030)
Urobilinogen, UA: 1 mg/dL (ref 0.0–1.0)
pH: 7.5 (ref 5.0–8.0)

## 2013-07-04 MED ORDER — DIPHENHYDRAMINE HCL 50 MG PO TABS: 50.0000 mg | ORAL_TABLET | Freq: Every evening | ORAL | Status: DC | PRN

## 2013-07-04 NOTE — Discharge Instructions (Signed)

## 2013-07-04 NOTE — MAU Note (Signed)
23 yo, G1P0 at [redacted]w[redacted]d presents to MAU with c/o contractions progressing since 0750 this morning. Contractions awakened her from sleep.  Reports she was seen on 5/19 and given Procardia for contractions; Reports SVE closed and thick at that time.  Denies VB, LOF. Reports +FM.

## 2013-07-04 NOTE — MAU Provider Note (Signed)
Chief Complaint:  Contractions   Amy Dickson is a 23 y.o.  G1P0000 with IUP at [redacted]w[redacted]d presenting for Contractions Patient started having contractions this morning around 8 am.  She felt pressure in her vagina but denies any leakage of fluid or vaginal bleeding. She has associated back pain. She isn't taking any pain medications. She was seen on 5/19 and noted to have large leukocytes on UA. She has some nausea but no vomiting.   Menstrual History: OB History   Grav Para Term Preterm Abortions TAB SAB Ect Mult Living   1 0 0 0 0 0 0 0 0 0       Patient's last menstrual period was 11/01/2012.      Past Medical History  Diagnosis Date  . Kidney stones 2009    Past Surgical History  Procedure Laterality Date  . Kidney stone surgery      Family History  Problem Relation Age of Onset  . Cancer Maternal Grandmother   . Diabetes Paternal Grandmother     History  Substance Use Topics  . Smoking status: Never Smoker   . Smokeless tobacco: Never Used  . Alcohol Use: No     No Known Allergies  Facility-administered medications prior to admission  Medication Dose Route Frequency Provider Last Rate Last Dose  . Tdap (BOOSTRIX) injection 0.5 mL  0.5 mL Intramuscular Once Hurshel Party, CNM       Prescriptions prior to admission  Medication Sig Dispense Refill  . cephALEXin (KEFLEX) 250 MG capsule Take 1 capsule (250 mg total) by mouth 4 (four) times daily.  28 capsule  0  . ondansetron (ZOFRAN-ODT) 8 MG disintegrating tablet Take 1 tablet (8 mg total) by mouth every 8 (eight) hours as needed for nausea or vomiting.  30 tablet  0    Review of Systems - Negative except for what is mentioned in HPI.  Physical Exam  Blood pressure 115/75, pulse 76, temperature 97.9 F (36.6 C), temperature source Oral, resp. rate 14, last menstrual period 11/01/2012. GENERAL: Well-developed, well-nourished female in no acute distress.  LUNGS: Clear to auscultation bilaterally.  HEART:  Regular rate and rhythm. ABDOMEN: Soft, nontender, nondistended, gravid.  EXTREMITIES: Nontender, no edema, 2+ distal pulses. Cervical Exam: Dilatation 0cm   Effacement 50%   Station -3   FHT:  Baseline rate 135 bpm   Variability moderate  Accelerations present   Decelerations none Contractions: Every 4 mins   Labs: Results for orders placed during the hospital encounter of 07/04/13 (from the past 24 hour(s))  URINALYSIS, ROUTINE W REFLEX MICROSCOPIC   Collection Time    07/04/13 10:38 AM      Result Value Ref Range   Color, Urine YELLOW  YELLOW   APPearance CLEAR  CLEAR   Specific Gravity, Urine 1.010  1.005 - 1.030   pH 7.5  5.0 - 8.0   Glucose, UA NEGATIVE  NEGATIVE mg/dL   Hgb urine dipstick NEGATIVE  NEGATIVE   Bilirubin Urine NEGATIVE  NEGATIVE   Ketones, ur NEGATIVE  NEGATIVE mg/dL   Protein, ur NEGATIVE  NEGATIVE mg/dL   Urobilinogen, UA 1.0  0.0 - 1.0 mg/dL   Nitrite NEGATIVE  NEGATIVE   Leukocytes, UA MODERATE (*) NEGATIVE  URINE MICROSCOPIC-ADD ON   Collection Time    07/04/13 10:38 AM      Result Value Ref Range   Squamous Epithelial / LPF FEW (*) RARE   WBC, UA 3-6  <3 WBC/hpf   RBC / HPF 0-2  <  3 RBC/hpf   Bacteria, UA FEW (*) RARE    Imaging Studies:  No results found.  Assessment: Mariel Kanskyshley B Gerald is  23 y.o. G1P0000 at 591w0d presents with Contractions 1. Braxton hicks ctx  2. UTI  Plan: Dicussed with Dorathy KinsmanVirginia Delorice Bannister, CNM.   #Braxton hicks: with ctx but close cervix. Will continue to monitor for 1 hour (11:55). Patient with no ctx on reevaluation and an unchanged cervix (1320). Reassuring fetal movements. Advised to keep adequately hydrated. Relaxation techniques to decrease ctx. Given warning indications for return.   - PO fluid  - Benadryl QHS PRN for sleep   #UTI: continuing treatment of keflex. UA today showing moderate leukocytes.  - continue current regimen.   Myra RudeJeremy E Schmitz 5/24/201511:44 AM  I was present for the exam and agree with  above. Contractions decreased w/ PO fluids.   [place] Eulan Heyward, [place] 07/04/2013 4:23 PM

## 2013-07-04 NOTE — MAU Provider Note (Signed)
Attestation of Attending Supervision of Advanced Practitioner (CNM/NP): Evaluation and management procedures were performed by the Advanced Practitioner under my supervision and collaboration.  I have reviewed the Advanced Practitioner's note and chart, and I agree with the management and plan.  Amy Dickson 5:55 PM

## 2013-07-09 ENCOUNTER — Encounter (HOSPITAL_COMMUNITY): Payer: Self-pay | Admitting: *Deleted

## 2013-07-09 ENCOUNTER — Inpatient Hospital Stay (HOSPITAL_COMMUNITY)
Admission: AD | Admit: 2013-07-09 | Discharge: 2013-07-09 | Disposition: A | Discharge status: Home / Self Care | Payer: Medicaid Other | Source: Ambulatory Visit | Attending: Obstetrics & Gynecology | Admitting: Obstetrics & Gynecology

## 2013-07-09 DIAGNOSIS — O4690 Antepartum hemorrhage, unspecified, unspecified trimester: Secondary | ICD-10-CM | POA: Insufficient documentation

## 2013-07-09 DIAGNOSIS — O4703 False labor before 37 completed weeks of gestation, third trimester: Secondary | ICD-10-CM

## 2013-07-09 DIAGNOSIS — O4700 False labor before 37 completed weeks of gestation, unspecified trimester: Secondary | ICD-10-CM | POA: Insufficient documentation

## 2013-07-09 LAB — URINE MICROSCOPIC-ADD ON

## 2013-07-09 LAB — URINALYSIS, ROUTINE W REFLEX MICROSCOPIC
Bilirubin Urine: NEGATIVE
GLUCOSE, UA: NEGATIVE mg/dL
KETONES UR: NEGATIVE mg/dL
NITRITE: NEGATIVE
PROTEIN: NEGATIVE mg/dL
Specific Gravity, Urine: 1.01 (ref 1.005–1.030)
Urobilinogen, UA: 0.2 mg/dL (ref 0.0–1.0)
pH: 6.5 (ref 5.0–8.0)

## 2013-07-09 LAB — OB RESULTS CONSOLE GC/CHLAMYDIA
CHLAMYDIA, DNA PROBE: NEGATIVE
GC PROBE AMP, GENITAL: NEGATIVE

## 2013-07-09 LAB — WET PREP, GENITAL
CLUE CELLS WET PREP: NONE SEEN
TRICH WET PREP: NONE SEEN
Yeast Wet Prep HPF POC: NONE SEEN

## 2013-07-09 LAB — OB RESULTS CONSOLE GBS: GBS: NEGATIVE

## 2013-07-09 NOTE — Discharge Instructions (Signed)
Third Trimester of Pregnancy  The third trimester is from week 29 through week 42, months 7 through 9. The third trimester is a time when the fetus is growing rapidly. At the end of the ninth month, the fetus is about 20 inches in length and weighs 6 10 pounds.   BODY CHANGES  Your body goes through many changes during pregnancy. The changes vary from woman to woman.    Your weight will continue to increase. You can expect to gain 25 35 pounds (11 16 kg) by the end of the pregnancy.   You may begin to get stretch marks on your hips, abdomen, and breasts.   You may urinate more often because the fetus is moving lower into your pelvis and pressing on your bladder.   You may develop or continue to have heartburn as a result of your pregnancy.   You may develop constipation because certain hormones are causing the muscles that push waste through your intestines to slow down.   You may develop hemorrhoids or swollen, bulging veins (varicose veins).   You may have pelvic pain because of the weight gain and pregnancy hormones relaxing your joints between the bones in your pelvis. Back aches may result from over exertion of the muscles supporting your posture.   Your breasts will continue to grow and be tender. A yellow discharge may leak from your breasts called colostrum.   Your belly button may stick out.   You may feel short of breath because of your expanding uterus.   You may notice the fetus "dropping," or moving lower in your abdomen.   You may have a bloody mucus discharge. This usually occurs a few days to a week before labor begins.   Your cervix becomes thin and soft (effaced) near your due date.  WHAT TO EXPECT AT YOUR PRENATAL EXAMS   You will have prenatal exams every 2 weeks until week 36. Then, you will have weekly prenatal exams. During a routine prenatal visit:   You will be weighed to make sure you and the fetus are growing normally.   Your blood pressure is taken.   Your abdomen will be  measured to track your baby's growth.   The fetal heartbeat will be listened to.   Any test results from the previous visit will be discussed.   You may have a cervical check near your due date to see if you have effaced.  At around 36 weeks, your caregiver will check your cervix. At the same time, your caregiver will also perform a test on the secretions of the vaginal tissue. This test is to determine if a type of bacteria, Group B streptococcus, is present. Your caregiver will explain this further.  Your caregiver may ask you:   What your birth plan is.   How you are feeling.   If you are feeling the baby move.   If you have had any abnormal symptoms, such as leaking fluid, bleeding, severe headaches, or abdominal cramping.   If you have any questions.  Other tests or screenings that may be performed during your third trimester include:   Blood tests that check for low iron levels (anemia).   Fetal testing to check the health, activity level, and growth of the fetus. Testing is done if you have certain medical conditions or if there are problems during the pregnancy.  FALSE LABOR  You may feel small, irregular contractions that eventually go away. These are called Braxton Hicks contractions, or   false labor. Contractions may last for hours, days, or even weeks before true labor sets in. If contractions come at regular intervals, intensify, or become painful, it is best to be seen by your caregiver.   SIGNS OF LABOR    Menstrual-like cramps.   Contractions that are 5 minutes apart or less.   Contractions that start on the top of the uterus and spread down to the lower abdomen and back.   A sense of increased pelvic pressure or back pain.   A watery or bloody mucus discharge that comes from the vagina.  If you have any of these signs before the 37th week of pregnancy, call your caregiver right away. You need to go to the hospital to get checked immediately.  HOME CARE INSTRUCTIONS    Avoid all  smoking, herbs, alcohol, and unprescribed drugs. These chemicals affect the formation and growth of the baby.   Follow your caregiver's instructions regarding medicine use. There are medicines that are either safe or unsafe to take during pregnancy.   Exercise only as directed by your caregiver. Experiencing uterine cramps is a good sign to stop exercising.   Continue to eat regular, healthy meals.   Wear a good support bra for breast tenderness.   Do not use hot tubs, steam rooms, or saunas.   Wear your seat belt at all times when driving.   Avoid raw meat, uncooked cheese, cat litter boxes, and soil used by cats. These carry germs that can cause birth defects in the baby.   Take your prenatal vitamins.   Try taking a stool softener (if your caregiver approves) if you develop constipation. Eat more high-fiber foods, such as fresh vegetables or fruit and whole grains. Drink plenty of fluids to keep your urine clear or pale yellow.   Take warm sitz baths to soothe any pain or discomfort caused by hemorrhoids. Use hemorrhoid cream if your caregiver approves.   If you develop varicose veins, wear support hose. Elevate your feet for 15 minutes, 3 4 times a day. Limit salt in your diet.   Avoid heavy lifting, wear low heal shoes, and practice good posture.   Rest a lot with your legs elevated if you have leg cramps or low back pain.   Visit your dentist if you have not gone during your pregnancy. Use a soft toothbrush to brush your teeth and be gentle when you floss.   A sexual relationship may be continued unless your caregiver directs you otherwise.   Do not travel far distances unless it is absolutely necessary and only with the approval of your caregiver.   Take prenatal classes to understand, practice, and ask questions about the labor and delivery.   Make a trial run to the hospital.   Pack your hospital bag.   Prepare the baby's nursery.   Continue to go to all your prenatal visits as directed  by your caregiver.  SEEK MEDICAL CARE IF:   You are unsure if you are in labor or if your water has broken.   You have dizziness.   You have mild pelvic cramps, pelvic pressure, or nagging pain in your abdominal area.   You have persistent nausea, vomiting, or diarrhea.   You have a bad smelling vaginal discharge.   You have pain with urination.  SEEK IMMEDIATE MEDICAL CARE IF:    You have a fever.   You are leaking fluid from your vagina.   You have spotting or bleeding from your vagina.     You have severe abdominal cramping or pain.   You have rapid weight loss or gain.   You have shortness of breath with chest pain.   You notice sudden or extreme swelling of your face, hands, ankles, feet, or legs.   You have not felt your baby move in over an hour.   You have severe headaches that do not go away with medicine.   You have vision changes.  Document Released: 01/22/2001 Document Revised: 09/30/2012 Document Reviewed: 03/31/2012  [employer] Patient Information 2014 [employer].

## 2013-07-09 NOTE — MAU Note (Signed)
Has been contracting since woke up, not regular.  Around noon, saw ? Blood.  Little later, had brownish d/c.  Feeling cramping.

## 2013-07-09 NOTE — MAU Provider Note (Signed)
Chief Complaint:  Labor Eval   Amy Dickson is a 23 y.o.  G1P0000 with IUP at 868w5d presenting for Labor Eval  Pt states that this AM she was having "medium strong" contractions every 10 min and then she noticed some mucous in the toilet with a small amount of brown blood. Since then, she has had small spots of brown blood on her pantyliner. However, her contractions have improved and are now minimal.    No fevers, chills, nausea, vomiting, lof.  +FM.       Menstrual History: OB History   Grav Para Term Preterm Abortions TAB SAB Ect Mult Living   1 0 0 0 0 0 0 0 0 0        Patient's last menstrual period was 11/01/2012.      Past Medical History  Diagnosis Date  . Kidney stones 2009    Past Surgical History  Procedure Laterality Date  . Kidney stone surgery      Family History  Problem Relation Age of Onset  . Cancer Maternal Grandmother   . Diabetes Paternal Grandmother     History  Substance Use Topics  . Smoking status: Never Smoker   . Smokeless tobacco: Never Used  . Alcohol Use: No     No Known Allergies  Facility-administered medications prior to admission  Medication Dose Route Frequency Provider Last Rate Last Dose  . Tdap (BOOSTRIX) injection 0.5 mL  0.5 mL Intramuscular Once Hurshel PartyLisa A Leftwich-Kirby, CNM       Prescriptions prior to admission  Medication Sig Dispense Refill  . cephALEXin (KEFLEX) 250 MG capsule Take 1 capsule (250 mg total) by mouth 4 (four) times daily.  28 capsule  0  . ondansetron (ZOFRAN-ODT) 8 MG disintegrating tablet Take 1 tablet (8 mg total) by mouth every 8 (eight) hours as needed for nausea or vomiting.  30 tablet  0    Review of Systems - Negative except for what is mentioned in HPI.  Physical Exam  Blood pressure 110/74, pulse 90, temperature 98.9 F (37.2 C), temperature source Oral, resp. rate 18, height 5\' 4"  (1.626 m), weight 64.411 kg (142 lb), last menstrual period 11/01/2012. GENERAL: Well-developed,  well-nourished female in no acute distress.  LUNGS: Clear to auscultation bilaterally.  HEART: Regular rate and rhythm. ABDOMEN: Soft, nontender, nondistended, gravid.  EXTREMITIES: Nontender, no edema, 2+ distal pulses. GU: NEFG, vaginal vault with no vaginal bleeding, cervix friable and after touch, small amount of pink discharge noted.  No tenderness.  Cervical Exam: Dilatation 0cm   Effacement 50%   Station -2   Presentation: cephalic FHT:  Baseline rate 145 bpm   Variability moderate  Accelerations present   Decelerations single variable but after variable.  Contractions: irregular, occasional.     Labs: Results for orders placed during the hospital encounter of 07/09/13 (from the past 24 hour(s))  URINALYSIS, ROUTINE W REFLEX MICROSCOPIC   Collection Time    07/09/13  5:45 PM      Result Value Ref Range   Color, Urine YELLOW  YELLOW   APPearance CLEAR  CLEAR   Specific Gravity, Urine 1.010  1.005 - 1.030   pH 6.5  5.0 - 8.0   Glucose, UA NEGATIVE  NEGATIVE mg/dL   Hgb urine dipstick MODERATE (*) NEGATIVE   Bilirubin Urine NEGATIVE  NEGATIVE   Ketones, ur NEGATIVE  NEGATIVE mg/dL   Protein, ur NEGATIVE  NEGATIVE mg/dL   Urobilinogen, UA 0.2  0.0 - 1.0 mg/dL  Nitrite NEGATIVE  NEGATIVE   Leukocytes, UA MODERATE (*) NEGATIVE  URINE MICROSCOPIC-ADD ON   Collection Time    07/09/13  5:45 PM      Result Value Ref Range   Squamous Epithelial / LPF MANY (*) RARE   WBC, UA 3-6  <3 WBC/hpf   RBC / HPF 3-6  <3 RBC/hpf   Bacteria, UA MANY (*) RARE    Imaging Studies:  No results found.  Assessment: Amy Dickson is  23 y.o. G1P0000 at [redacted]w[redacted]d presents with Labor Eval .  Plan: 1) labor eval - contractions improved. Irregular on toco - cervix closed - reassurance given - labor precautions discussed  2) Vaginal bleeding - ddx includes cervical ectropion vs possible friable cervix from infection - no active bleeding seen in the vault.   - bleeding precautions  discussed - wet prep/gc/chl sent- wet prep neg - f/u if worsens or changes  3) FWB - cat I tracing - GBS collected given GA and missed appt.    D/c home with f/u as scheduled in clinic.   Vale Haven 5/29/20156:54 PM

## 2013-07-10 LAB — GC/CHLAMYDIA PROBE AMP
CT Probe RNA: NEGATIVE
GC Probe RNA: NEGATIVE

## 2013-07-11 LAB — CULTURE, BETA STREP (GROUP B ONLY): Special Requests: NORMAL

## 2013-07-12 ENCOUNTER — Encounter: Payer: Self-pay | Admitting: Family Medicine

## 2013-07-13 ENCOUNTER — Encounter: Payer: Self-pay | Admitting: Advanced Practice Midwife

## 2013-07-13 ENCOUNTER — Ambulatory Visit (INDEPENDENT_AMBULATORY_CARE_PROVIDER_SITE_OTHER): Payer: Medicaid Other | Admitting: Advanced Practice Midwife

## 2013-07-13 VITALS — BP 104/69 | HR 96 | Temp 97.6°F | Wt 141.6 lb

## 2013-07-13 DIAGNOSIS — Z3401 Encounter for supervision of normal first pregnancy, first trimester: Secondary | ICD-10-CM

## 2013-07-13 DIAGNOSIS — Z3400 Encounter for supervision of normal first pregnancy, unspecified trimester: Secondary | ICD-10-CM

## 2013-07-13 LAB — POCT URINALYSIS DIP (DEVICE)
Bilirubin Urine: NEGATIVE
GLUCOSE, UA: NEGATIVE mg/dL
Hgb urine dipstick: NEGATIVE
KETONES UR: NEGATIVE mg/dL
Nitrite: NEGATIVE
Protein, ur: NEGATIVE mg/dL
SPECIFIC GRAVITY, URINE: 1.015 (ref 1.005–1.030)
Urobilinogen, UA: 1 mg/dL (ref 0.0–1.0)
pH: 7 (ref 5.0–8.0)

## 2013-07-13 MED ORDER — PROMETHAZINE HCL 25 MG PO TABS: 25.0000 mg | ORAL_TABLET | Freq: Four times a day (QID) | ORAL | Status: DC | PRN

## 2013-07-13 NOTE — Patient Instructions (Signed)

## 2013-07-13 NOTE — Addendum Note (Signed)
Addended by: Aviva Signs on: 07/13/2013 02:44 PM   Modules accepted: Orders

## 2013-07-13 NOTE — Progress Notes (Signed)
Patient requests refill of phenergan.  Reports intermittent lower abdominal/pelvic pressure and irregular contractions.

## 2013-07-13 NOTE — Progress Notes (Signed)
GBS Negative. Reviewed signs of labor

## 2013-07-23 ENCOUNTER — Ambulatory Visit (INDEPENDENT_AMBULATORY_CARE_PROVIDER_SITE_OTHER): Payer: Medicaid Other | Admitting: Advanced Practice Midwife

## 2013-07-23 ENCOUNTER — Encounter (HOSPITAL_COMMUNITY): Payer: Self-pay | Admitting: *Deleted

## 2013-07-23 ENCOUNTER — Inpatient Hospital Stay (HOSPITAL_COMMUNITY)
Admission: AD | Admit: 2013-07-23 | Discharge: 2013-07-23 | Disposition: A | Discharge status: Home / Self Care | Payer: Medicaid Other | Source: Ambulatory Visit | Attending: Obstetrics & Gynecology | Admitting: Obstetrics & Gynecology

## 2013-07-23 VITALS — BP 108/74 | HR 88 | Temp 98.2°F | Wt 138.7 lb

## 2013-07-23 DIAGNOSIS — O479 False labor, unspecified: Secondary | ICD-10-CM | POA: Insufficient documentation

## 2013-07-23 DIAGNOSIS — O26899 Other specified pregnancy related conditions, unspecified trimester: Secondary | ICD-10-CM

## 2013-07-23 DIAGNOSIS — R109 Unspecified abdominal pain: Secondary | ICD-10-CM

## 2013-07-23 LAB — POCT URINALYSIS DIP (DEVICE)
BILIRUBIN URINE: NEGATIVE
Glucose, UA: NEGATIVE mg/dL
HGB URINE DIPSTICK: NEGATIVE
Ketones, ur: NEGATIVE mg/dL
Nitrite: NEGATIVE
PH: 6.5 (ref 5.0–8.0)
Protein, ur: NEGATIVE mg/dL
SPECIFIC GRAVITY, URINE: 1.015 (ref 1.005–1.030)
Urobilinogen, UA: 1 mg/dL (ref 0.0–1.0)

## 2013-07-23 MED ORDER — PROMETHAZINE HCL 25 MG/ML IJ SOLN
12.5000 mg | Freq: Four times a day (QID) | INTRAMUSCULAR | Status: DC | PRN
  Administered 2013-07-23: 12.5 mg via INTRAMUSCULAR
  Filled 2013-07-23: qty 1

## 2013-07-23 MED ORDER — NALBUPHINE HCL 10 MG/ML IJ SOLN
10.0000 mg | INTRAMUSCULAR | Status: DC | PRN
  Administered 2013-07-23: 10 mg via INTRAMUSCULAR
  Filled 2013-07-23: qty 1

## 2013-07-23 MED ORDER — PROMETHAZINE HCL 25 MG/ML IJ SOLN: 25.0000 mg | Freq: Four times a day (QID) | INTRAMUSCULAR | Status: DC | PRN

## 2013-07-23 NOTE — Discharge Instructions (Signed)

## 2013-07-23 NOTE — Progress Notes (Signed)
Occasional edema in feet. Reports very painful contractions last night-- on and off for 45 minutes-- but then subsided.

## 2013-07-23 NOTE — Progress Notes (Signed)
Pt crying with cramping/contractions.  Reports contractions 5-6/hour.   Good fetal movement, denies vaginal bleeding, LOF.  Nausea today, left home without taking nausea medication. Bloody show with cervical exam. Send to MAU for labor evaluation/nausea management.

## 2013-07-23 NOTE — MAU Note (Addendum)
Patient brought from lobby to room via wheelchair for labor eval; patient sent from clinic. States was 1cm/70% in office. Reports contractions and bloody show. Denies LOF at this time. Reports good fetal movement.

## 2013-07-24 ENCOUNTER — Inpatient Hospital Stay (HOSPITAL_COMMUNITY)
Admission: AD | Admit: 2013-07-24 | Discharge: 2013-07-24 | Disposition: A | Discharge status: Home / Self Care | Payer: Medicaid Other | Source: Ambulatory Visit | Attending: Obstetrics & Gynecology | Admitting: Obstetrics & Gynecology

## 2013-07-24 ENCOUNTER — Inpatient Hospital Stay (HOSPITAL_COMMUNITY)
Admission: AD | Admit: 2013-07-24 | Discharge: 2013-07-24 | Disposition: A | Discharge status: Home / Self Care | Payer: Medicaid Other | Source: Ambulatory Visit | Attending: Obstetrics and Gynecology | Admitting: Obstetrics and Gynecology

## 2013-07-24 ENCOUNTER — Encounter (HOSPITAL_COMMUNITY): Payer: Self-pay | Admitting: *Deleted

## 2013-07-24 DIAGNOSIS — Z87442 Personal history of urinary calculi: Secondary | ICD-10-CM

## 2013-07-24 DIAGNOSIS — Z833 Family history of diabetes mellitus: Secondary | ICD-10-CM

## 2013-07-24 MED ORDER — MORPHINE SULFATE 10 MG/ML IJ SOLN
8.0000 mg | Freq: Once | INTRAMUSCULAR | Status: AC
  Administered 2013-07-24: 8 mg via INTRAMUSCULAR
  Filled 2013-07-24: qty 1

## 2013-07-24 MED ORDER — DIPHENHYDRAMINE HCL 50 MG/ML IJ SOLN
25.0000 mg | Freq: Once | INTRAMUSCULAR | Status: AC
  Administered 2013-07-24: 25 mg via INTRAMUSCULAR
  Filled 2013-07-24: qty 1

## 2013-07-24 MED ORDER — ZOLPIDEM TARTRATE 5 MG PO TABS
5.0000 mg | ORAL_TABLET | Freq: Once | ORAL | Status: AC
  Administered 2013-07-24: 5 mg via ORAL
  Filled 2013-07-24: qty 1

## 2013-07-24 NOTE — Progress Notes (Signed)
[facility] DeputyKim [facility]aw [facility] Aware of ctx pattern, reck of cervix with not change. Pt stable for d/c home with Ambien 5mg  po

## 2013-07-24 NOTE — MAU Note (Signed)
Pt DC'd from MAU this a.m., pt states went home & slept, took a warm bath, & then woke up bleeding.  Unsure if SROM, states she has been wet between her legs.

## 2013-07-24 NOTE — Progress Notes (Signed)
Philipp DeputyKim Shaw CNM notified of pt's admission and status. Will observe for an hour and reck cervix. Pt may walk once strip reactive.

## 2013-07-24 NOTE — Discharge Instructions (Signed)
Third Trimester of Pregnancy  The third trimester is from week 29 through week 42, months 7 through 9. The third trimester is a time when the fetus is growing rapidly. At the end of the ninth month, the fetus is about 20 inches in length and weighs 6 10 pounds.   BODY CHANGES  Your body goes through many changes during pregnancy. The changes vary from woman to woman.    Your weight will continue to increase. You can expect to gain 25 35 pounds (11 16 kg) by the end of the pregnancy.   You may begin to get stretch marks on your hips, abdomen, and breasts.   You may urinate more often because the fetus is moving lower into your pelvis and pressing on your bladder.   You may develop or continue to have heartburn as a result of your pregnancy.   You may develop constipation because certain hormones are causing the muscles that push waste through your intestines to slow down.   You may develop hemorrhoids or swollen, bulging veins (varicose veins).   You may have pelvic pain because of the weight gain and pregnancy hormones relaxing your joints between the bones in your pelvis. Back aches may result from over exertion of the muscles supporting your posture.   Your breasts will continue to grow and be tender. A yellow discharge may leak from your breasts called colostrum.   Your belly button may stick out.   You may feel short of breath because of your expanding uterus.   You may notice the fetus "dropping," or moving lower in your abdomen.   You may have a bloody mucus discharge. This usually occurs a few days to a week before labor begins.   Your cervix becomes thin and soft (effaced) near your due date.  WHAT TO EXPECT AT YOUR PRENATAL EXAMS   You will have prenatal exams every 2 weeks until week 36. Then, you will have weekly prenatal exams. During a routine prenatal visit:   You will be weighed to make sure you and the fetus are growing normally.   Your blood pressure is taken.   Your abdomen will be  measured to track your baby's growth.   The fetal heartbeat will be listened to.   Any test results from the previous visit will be discussed.   You may have a cervical check near your due date to see if you have effaced.  At around 36 weeks, your caregiver will check your cervix. At the same time, your caregiver will also perform a test on the secretions of the vaginal tissue. This test is to determine if a type of bacteria, Group B streptococcus, is present. Your caregiver will explain this further.  Your caregiver may ask you:   What your birth plan is.   How you are feeling.   If you are feeling the baby move.   If you have had any abnormal symptoms, such as leaking fluid, bleeding, severe headaches, or abdominal cramping.   If you have any questions.  Other tests or screenings that may be performed during your third trimester include:   Blood tests that check for low iron levels (anemia).   Fetal testing to check the health, activity level, and growth of the fetus. Testing is done if you have certain medical conditions or if there are problems during the pregnancy.  FALSE LABOR  You may feel small, irregular contractions that eventually go away. These are called Braxton Hicks contractions, or   false labor. Contractions may last for hours, days, or even weeks before true labor sets in. If contractions come at regular intervals, intensify, or become painful, it is best to be seen by your caregiver.   SIGNS OF LABOR    Menstrual-like cramps.   Contractions that are 5 minutes apart or less.   Contractions that start on the top of the uterus and spread down to the lower abdomen and back.   A sense of increased pelvic pressure or back pain.   A watery or bloody mucus discharge that comes from the vagina.  If you have any of these signs before the 37th week of pregnancy, call your caregiver right away. You need to go to the hospital to get checked immediately.  HOME CARE INSTRUCTIONS    Avoid all  smoking, herbs, alcohol, and unprescribed drugs. These chemicals affect the formation and growth of the baby.   Follow your caregiver's instructions regarding medicine use. There are medicines that are either safe or unsafe to take during pregnancy.   Exercise only as directed by your caregiver. Experiencing uterine cramps is a good sign to stop exercising.   Continue to eat regular, healthy meals.   Wear a good support bra for breast tenderness.   Do not use hot tubs, steam rooms, or saunas.   Wear your seat belt at all times when driving.   Avoid raw meat, uncooked cheese, cat litter boxes, and soil used by cats. These carry germs that can cause birth defects in the baby.   Take your prenatal vitamins.   Try taking a stool softener (if your caregiver approves) if you develop constipation. Eat more high-fiber foods, such as fresh vegetables or fruit and whole grains. Drink plenty of fluids to keep your urine clear or pale yellow.   Take warm sitz baths to soothe any pain or discomfort caused by hemorrhoids. Use hemorrhoid cream if your caregiver approves.   If you develop varicose veins, wear support hose. Elevate your feet for 15 minutes, 3 4 times a day. Limit salt in your diet.   Avoid heavy lifting, wear low heal shoes, and practice good posture.   Rest a lot with your legs elevated if you have leg cramps or low back pain.   Visit your dentist if you have not gone during your pregnancy. Use a soft toothbrush to brush your teeth and be gentle when you floss.   A sexual relationship may be continued unless your caregiver directs you otherwise.   Do not travel far distances unless it is absolutely necessary and only with the approval of your caregiver.   Take prenatal classes to understand, practice, and ask questions about the labor and delivery.   Make a trial run to the hospital.   Pack your hospital bag.   Prepare the baby's nursery.   Continue to go to all your prenatal visits as directed  by your caregiver.  SEEK MEDICAL CARE IF:   You are unsure if you are in labor or if your water has broken.   You have dizziness.   You have mild pelvic cramps, pelvic pressure, or nagging pain in your abdominal area.   You have persistent nausea, vomiting, or diarrhea.   You have a bad smelling vaginal discharge.   You have pain with urination.  SEEK IMMEDIATE MEDICAL CARE IF:    You have a fever.   You are leaking fluid from your vagina.   You have spotting or bleeding from your vagina.     You have severe abdominal cramping or pain.   You have rapid weight loss or gain.   You have shortness of breath with chest pain.   You notice sudden or extreme swelling of your face, hands, ankles, feet, or legs.   You have not felt your baby move in over an hour.   You have severe headaches that do not go away with medicine.   You have vision changes.  Document Released: 01/22/2001 Document Revised: 09/30/2012 Document Reviewed: 03/31/2012  [employer] Patient Information 2014 [employer].

## 2013-07-25 ENCOUNTER — Encounter (HOSPITAL_COMMUNITY): Payer: Self-pay | Admitting: Family

## 2013-07-25 ENCOUNTER — Inpatient Hospital Stay (HOSPITAL_COMMUNITY): Payer: Medicaid Other | Admitting: Anesthesiology

## 2013-07-25 ENCOUNTER — Inpatient Hospital Stay (HOSPITAL_COMMUNITY)
Admission: AD | Admit: 2013-07-25 | Discharge: 2013-07-27 | DRG: 774 | Disposition: A | Discharge status: Home / Self Care | Payer: Medicaid Other | Source: Ambulatory Visit | Attending: Family Medicine | Admitting: Family Medicine

## 2013-07-25 ENCOUNTER — Encounter (HOSPITAL_COMMUNITY): Payer: Medicaid Other | Admitting: Anesthesiology

## 2013-07-25 DIAGNOSIS — Z833 Family history of diabetes mellitus: Secondary | ICD-10-CM | POA: Diagnosis not present

## 2013-07-25 LAB — CBC
HEMATOCRIT: 33 % — AB (ref 36.0–46.0)
HEMOGLOBIN: 10.7 g/dL — AB (ref 12.0–15.0)
MCH: 26.8 pg (ref 26.0–34.0)
MCHC: 32.4 g/dL (ref 30.0–36.0)
MCV: 82.5 fL (ref 78.0–100.0)
Platelets: 282 10*3/uL (ref 150–400)
RBC: 4 MIL/uL (ref 3.87–5.11)
RDW: 14.5 % (ref 11.5–15.5)
WBC: 7.8 10*3/uL (ref 4.0–10.5)

## 2013-07-25 LAB — RPR

## 2013-07-25 MED ORDER — ACETAMINOPHEN 325 MG PO TABS
650.0000 mg | ORAL_TABLET | ORAL | Status: DC | PRN
  Administered 2013-07-25: 650 mg via ORAL
  Filled 2013-07-25: qty 2

## 2013-07-25 MED ORDER — EPHEDRINE 5 MG/ML INJ
10.0000 mg | INTRAVENOUS | Status: DC | PRN
  Filled 2013-07-25: qty 4

## 2013-07-25 MED ORDER — PHENYLEPHRINE 40 MCG/ML (10ML) SYRINGE FOR IV PUSH (FOR BLOOD PRESSURE SUPPORT)
80.0000 ug | PREFILLED_SYRINGE | INTRAVENOUS | Status: DC | PRN
  Filled 2013-07-25: qty 10

## 2013-07-25 MED ORDER — DIPHENHYDRAMINE HCL 50 MG/ML IJ SOLN: 12.5000 mg | INTRAMUSCULAR | Status: DC | PRN

## 2013-07-25 MED ORDER — OXYCODONE-ACETAMINOPHEN 5-325 MG PO TABS
1.0000 | ORAL_TABLET | ORAL | Status: DC | PRN
  Administered 2013-07-26: 2 via ORAL
  Filled 2013-07-25: qty 2

## 2013-07-25 MED ORDER — LACTATED RINGERS IV SOLN
500.0000 mL | Freq: Once | INTRAVENOUS | Status: AC
  Administered 2013-07-25: 14:00:00 via INTRAVENOUS

## 2013-07-25 MED ORDER — FENTANYL CITRATE 0.05 MG/ML IJ SOLN
100.0000 ug | Freq: Once | INTRAMUSCULAR | Status: AC | PRN
  Administered 2013-07-25: 100 ug via INTRAVENOUS
  Filled 2013-07-25: qty 2

## 2013-07-25 MED ORDER — FENTANYL 2.5 MCG/ML BUPIVACAINE 1/10 % EPIDURAL INFUSION (WH - ANES)
INTRAMUSCULAR | Status: DC | PRN
  Administered 2013-07-25: 14 mL/h via EPIDURAL

## 2013-07-25 MED ORDER — LIDOCAINE HCL (PF) 1 % IJ SOLN
30.0000 mL | INTRAMUSCULAR | Status: DC | PRN
  Filled 2013-07-25: qty 30

## 2013-07-25 MED ORDER — EPHEDRINE 5 MG/ML INJ: 10.0000 mg | INTRAVENOUS | Status: DC | PRN

## 2013-07-25 MED ORDER — LACTATED RINGERS IV SOLN
500.0000 mL | INTRAVENOUS | Status: DC | PRN
  Administered 2013-07-25: 500 mL via INTRAVENOUS

## 2013-07-25 MED ORDER — OXYTOCIN 40 UNITS IN LACTATED RINGERS INFUSION - SIMPLE MED
62.5000 mL/h | INTRAVENOUS | Status: DC
  Filled 2013-07-25: qty 1000

## 2013-07-25 MED ORDER — GENTAMICIN SULFATE 40 MG/ML IJ SOLN
160.0000 mg | Freq: Three times a day (TID) | INTRAVENOUS | Status: DC
  Administered 2013-07-25: 160 mg via INTRAVENOUS
  Filled 2013-07-25: qty 4

## 2013-07-25 MED ORDER — LIDOCAINE HCL (PF) 1 % IJ SOLN
INTRAMUSCULAR | Status: DC | PRN
  Administered 2013-07-25 (×2): 5 mL
  Administered 2013-07-25: 3 mL

## 2013-07-25 MED ORDER — CITRIC ACID-SODIUM CITRATE 334-500 MG/5ML PO SOLN: 30.0000 mL | ORAL | Status: DC | PRN

## 2013-07-25 MED ORDER — IBUPROFEN 600 MG PO TABS
600.0000 mg | ORAL_TABLET | Freq: Four times a day (QID) | ORAL | Status: DC | PRN
  Administered 2013-07-26: 600 mg via ORAL
  Filled 2013-07-25: qty 1

## 2013-07-25 MED ORDER — FLEET ENEMA 7-19 GM/118ML RE ENEM: 1.0000 | ENEMA | RECTAL | Status: DC | PRN

## 2013-07-25 MED ORDER — PHENYLEPHRINE 40 MCG/ML (10ML) SYRINGE FOR IV PUSH (FOR BLOOD PRESSURE SUPPORT): 80.0000 ug | PREFILLED_SYRINGE | INTRAVENOUS | Status: DC | PRN

## 2013-07-25 MED ORDER — FENTANYL 2.5 MCG/ML BUPIVACAINE 1/10 % EPIDURAL INFUSION (WH - ANES)
14.0000 mL/h | INTRAMUSCULAR | Status: DC | PRN
  Administered 2013-07-25 (×2): 14 mL/h via EPIDURAL
  Filled 2013-07-25 (×2): qty 125

## 2013-07-25 MED ORDER — LACTATED RINGERS IV SOLN
INTRAVENOUS | Status: DC
  Administered 2013-07-25 (×3): via INTRAVENOUS

## 2013-07-25 MED ORDER — ONDANSETRON HCL 4 MG/2ML IJ SOLN
4.0000 mg | Freq: Four times a day (QID) | INTRAMUSCULAR | Status: DC | PRN
  Administered 2013-07-25: 4 mg via INTRAVENOUS
  Filled 2013-07-25: qty 2

## 2013-07-25 MED ORDER — OXYTOCIN BOLUS FROM INFUSION
500.0000 mL | INTRAVENOUS | Status: DC
  Administered 2013-07-25: 500 mL via INTRAVENOUS

## 2013-07-25 MED ORDER — AMPICILLIN SODIUM 2 G IJ SOLR
2.0000 g | Freq: Four times a day (QID) | INTRAMUSCULAR | Status: DC
  Administered 2013-07-25: 2 g via INTRAVENOUS
  Filled 2013-07-25 (×2): qty 2000

## 2013-07-25 NOTE — Progress Notes (Signed)
Amy Dickson is a 23 y.o. G1P0000 at 2338w0d.  Subjective: Comfortable w/ epidural.  Objective: BP 121/79  Pulse 87  Temp(Src) 98.2 F (36.8 C) (Oral)  Resp 20  Ht 5\' 4"  (1.626 m)  Wt 62.596 kg (138 lb)  BMI 23.68 kg/m2  SpO2 100%  LMP 11/01/2012      FHT:  FHR: 135 bpm, variability: moderate,  accelerations:  Present,  decelerations:  Absent UC:   regular, every 1-4 minutes, mod-strong SVE:   Dilation: 5.5 Effacement (%): 100 Station: -1;-2 Exam by:: [email or web address]  Labs: Lab Results  Component Value Date   WBC 7.8 07/25/2013   HGB 10.7* 07/25/2013   HCT 33.0* 07/25/2013   MCV 82.5 07/25/2013   PLT 282 07/25/2013    Assessment / Plan: Spontaneous labor, progressing normally  Labor: Progressing normally Preeclampsia:  NA Fetal Wellbeing:  Category I Pain Control:  Epidural I/D:  n/a Anticipated MOD:  NSVD  [name] 07/25/2013, 5:39 PM

## 2013-07-25 NOTE — Progress Notes (Signed)
Amy Dickson is a 23 y.o. G1P0000 at 833w0d.  Subjective: Comfortable w/ epidural. Feeling pressure, wants to try pushing. Pushed for 15 mins, grew tired and decided to rest and labor down.   Objective: BP 117/69  Pulse 84  Temp(Src) 99.5 F (37.5 C) (Oral)  Resp 18  Ht 5\' 4"  (1.626 m)  Wt 62.596 kg (138 lb)  BMI 23.68 kg/m2  SpO2 100%  LMP 11/01/2012  Fever reported at 102.  Total I/O In: -  Out: 300 [Urine:300]  FHT:  Baseline 160, accels present, decels variable x 1 UC:   regular, every 1-4 minutes, mod-strong SVE:   Dilation: 10 Effacement (%): 100 Station: +2 Exam by:: Dr. Daylene KatayamaAlexndra  Labs: Lab Results  Component Value Date   WBC 7.8 07/25/2013   HGB 10.7* 07/25/2013   HCT 33.0* 07/25/2013   MCV 82.5 07/25/2013   PLT 282 07/25/2013     Filed Vitals:   07/25/13 2001 07/25/13 2030 07/25/13 2100 07/25/13 2130  BP: 118/72 106/91 122/75 117/69  Pulse: 77 78 76 84  Temp:  100.2 F (37.9 C)  99.5 F (37.5 C)  TempSrc:  Oral  Oral  Resp: 18 18 18 18   Height:      Weight:      SpO2:         Assessment / Plan: Spontaneous labor, progressing normally Intrapartum fever: ampicillin and gentamicin ordered  Labor: Progressing normally Preeclampsia:  NA Fetal Wellbeing:  Category I Pain Control:  Epidural I/D:  Intrapartum fever as above, pt on amp and gent, fluid bolused, continue IV fluids and vitals per protocol Anticipated MOD:  NSVD  Amy Dickson, Amy Dickson 07/25/2013, 9:49 PM

## 2013-07-25 NOTE — Progress Notes (Signed)
ANTIBIOTIC CONSULT NOTE - INITIAL  Pharmacy Consult for Gentamicin Indication: Chorioamnionitis/ Maternal temp  No Known Allergies  Patient Measurements: Height: 5\' 4"  (162.6 cm) Weight: 138 lb (62.596 kg) (2 days prior) IBW/kg (Calculated) : 54.7 Adjusted Body Weight: 62.6kg  Vital Signs: Temp: 99.5 F (37.5 C) (06/14 2130) Temp src: Oral (06/14 2130) BP: 117/69 mmHg (06/14 2130) Pulse Rate: 84 (06/14 2130) Intake/Output from previous day:   Intake/Output from this shift: Total I/O In: -  Out: 300 [Urine:300]  Labs:  Recent Labs  07/25/13 1020  WBC 7.8  HGB 10.7*  PLT 282   Estimated Creatinine Clearance: 93.6 ml/min (by C-G formula based on Cr of 0.52). No results found for this basename: VANCOTROUGH, Leodis BinetVANCOPEAK, VANCORANDOM, GENTTROUGH, GENTPEAK, GENTRANDOM, TOBRATROUGH, TOBRAPEAK, TOBRARND, AMIKACINPEAK, AMIKACINTROU, AMIKACIN,  in the last 72 hours   Microbiology: Recent Results (from the past 720 hour(s))  URINE CULTURE     Status: None   Collection Time    06/29/13  4:58 AM      Result Value Ref Range Status   Specimen Description URINE, RANDOM   Final   Special Requests NONE   Final   Culture  Setup Time     Final   Value: 06/29/2013 13:44     Performed at [facility]   Colony Count     Final   Value: >=100,000 COLONIES/ML     [facility]formed at [facility]   Culture     Final   Value: Multiple bacterial morphotypes present, none predominant. Suggest appropriate recollection if clinically indicated.     Performed at [facility]   Report Status 07/01/2013 FINAL   Final  CULTURE, OB URINE     Status: None   Collection Time    06/29/13  3:30 PM      Result Value Ref Range Status   Culture STAPHYLOCOCCUS AUREUS   Final   Colony Count 85,000 COLONIES/ML   Final   Organism ID, Bacteria STAPHYLOCOCCUS AUREUS   Final   Comment: Rifampin and Gentamicin should not be used as     single drugs for treatment of Staph infections.     NO  GROUP B STREP (S.AGALACTIAE) ISOLATED     Culture based screening of vaginal/anorectal swabs at     35 to [redacted] weeks gestation is required to rule out the     carriage of Group B Streptococcus.  OB RESULTS CONSOLE GBS     Status: None   Collection Time    07/09/13 12:00 AM      Result Value Ref Range Status   GBS Negative   Final  WET PREP, GENITAL     Status: Abnormal   Collection Time    07/09/13  6:45 PM      Result Value Ref Range Status   Yeast Wet Prep HPF POC NONE SEEN  NONE SEEN Final   Trich, Wet Prep NONE SEEN  NONE SEEN Final   Clue Cells Wet Prep HPF POC NONE SEEN  NONE SEEN Final   WBC, Wet Prep HPF POC FEW (*) NONE SEEN Final   Comment: MODERATE BACTERIA SEEN  GC/CHLAMYDIA PROBE AMP     Status: None   Collection Time    07/09/13  6:45 PM      Result Value Ref Range Status   CT Probe RNA NEGATIVE  NEGATIVE Final   GC Probe RNA NEGATIVE  NEGATIVE Final   Comment: (NOTE)                                                                                               **  Normal Reference Range: Negative**          Assay performed using the Gen-Probe APTIMA COMBO2 (R) Assay.     Acceptable specimen types for this assay include APTIMA Swabs (Unisex,     endocervical, urethral, or vaginal), first void urine, and ThinPrep     liquid based cytology samples.     Performed at [facility]  CULTURE, BETA STREP (GROUP B ONLY)     Status: None   Collection Time    07/09/13  6:45 PM      Result Value Ref Range Status   Specimen Description VAGINAL/RECTAL   Final   Special Requests Normal   Final   Culture     Final   Value: NO GROUP B STREP (S.AGALACTIAE) ISOLATED     Performed at [facility]   Report Status 07/11/2013 FINAL   Final    Medical History: Past Medical History  Diagnosis Date  . Kidney stones 2009    Medications:  Ampicillin 2 gram IV q6h Assessment: 22yo F 38+ weeks admitted in labor. Pt has now developed maternal temp during labor.  Ampicillin and Gentamicin initiated to rule out chorioamnionitis.  Goal of Therapy:  Gentamicin peaks 6-248mcg/ml and trough < 671mcg/ml  Plan:  1. Gentamicin 160mg  iv q8h, 2. Will continue to follow and draw Scr if continued postpartum. Will follow to assess need for further kinetic workup. Thanks!  Claybon Jabsngel, Jeriah Corkum G 07/25/2013,9:44 PM

## 2013-07-25 NOTE — Anesthesia Preprocedure Evaluation (Signed)
Anesthesia Evaluation  Patient identified by MRN, date of birth, ID band Patient awake    Reviewed: Allergy & Precautions, H&P , NPO status , Patient's Chart, lab work & pertinent test results  History of Anesthesia Complications Negative for: history of anesthetic complications  Airway Mallampati: II TM Distance: >3 FB Neck ROM: full    Dental no notable dental hx. (+) Teeth Intact   Pulmonary neg pulmonary ROS,  breath sounds clear to auscultation  Pulmonary exam normal       Cardiovascular negative cardio ROS  Rhythm:regular Rate:Normal     Neuro/Psych negative neurological ROS  negative psych ROS   GI/Hepatic negative GI ROS, Neg liver ROS,   Endo/Other  negative endocrine ROS  Renal/GU negative Renal ROS  negative genitourinary   Musculoskeletal   Abdominal Normal abdominal exam  (+)   Peds  Hematology negative hematology ROS (+)   Anesthesia Other Findings   Reproductive/Obstetrics (+) Pregnancy                           Anesthesia Physical Anesthesia Plan  ASA: II and emergent  Anesthesia Plan: Epidural   Post-op Pain Management:    Induction:   Airway Management Planned:   Additional Equipment:   Intra-op Plan:   Post-operative Plan:   Informed Consent: I have reviewed the patients History and Physical, chart, labs and discussed the procedure including the risks, benefits and alternatives for the proposed anesthesia with the patient or authorized representative who has indicated his/her understanding and acceptance.     Plan Discussed with:   Anesthesia Plan Comments:         Anesthesia Quick Evaluation

## 2013-07-25 NOTE — H&P (Signed)
HPI: Amy Dickson is a 23 y.o. year old 791P0000 female at 7362w0d weeks gestation by LMP verified by 10.6 week US who presents to MAU reporting Labor. Changed from 3 to 5 cm while in MAU.  Denies LOF, VB.   Patient Active Problem List   Diagnosis Date Noted  . Back spasm 06/02/2013  . Nausea and vomiting in pregnancy prior to [redacted] weeks gestation 01/20/2013  . Encounter for supervision of normal first pregnancy in first trimester 01/20/2013  . NEPHROLITHIASIS 09/24/2007   [facility] Genetic Screen   Declined  Anatomic US  nl female  Glucose Screen  97  GBS neg  Feeding Preference  breast  Contraception   Circumcision NA  Flu Vaccine: 01/20/13 TDaP:05/19/2013   Maternal Medical History:  Reason for admission: Nausea.    OB History   Grav Para Term Preterm Abortions TAB SAB Ect Mult Living   1 0 0 0 0 0 0 0 0 0      Past Medical History  Diagnosis Date  . Kidney stones 2009   Past Surgical History  Procedure Laterality Date  . Kidney stone surgery     Family History: family history includes Cancer in her maternal grandmother; Diabetes in her paternal grandmother. Social History:  reports that she has never smoked. She has never used smokeless tobacco. She reports that she does not drink alcohol or use illicit drugs.   Prenatal Transfer Tool  Maternal Diabetes: No Genetic Screening: Normal Maternal Ultrasounds/Referrals: Normal Fetal Ultrasounds or other Referrals:  None Maternal Substance Abuse:  No Significant Maternal Medications:  None Significant Maternal Lab Results:  Lab values include: Group B Strep negative Other Comments:  None  Review of Systems  Constitutional: Negative for fever and chills.  Eyes: Negative for blurred vision and double vision.  Gastrointestinal: Positive for abdominal pain (contractions). Negative for nausea and vomiting.  Neurological: Negative for headaches.    Dilation: 5 Effacement (%): 90 Station: -2 Exam by:: Dellie BurnsScarlett  Murray, RN BSN Blood pressure 134/93, pulse 107, temperature 98.1 F (36.7 C), temperature source Oral, resp. rate 20, height 5\' 4"  (1.626 m), weight 62.596 kg (138 lb), last menstrual period 11/01/2012. Maternal Exam:  Uterine Assessment: Contraction strength is moderate.  Contraction frequency is regular.   Abdomen: Estimated fetal weight is AGA.   Fetal presentation: vertex  Introitus: Normal vulva. Normal vagina.  Pelvis: adequate for delivery.   Cervix: Cervix evaluated by digital exam.     Fetal Exam Fetal Monitor Review: Mode: ultrasound.   Baseline rate: 135.  Variability: moderate (6-25 bpm).   Pattern: accelerations present and no decelerations.    Fetal State Assessment: Category I - tracings are normal.     Physical Exam  Nursing note and vitals reviewed. Constitutional: She is oriented to person, place, and time. She appears well-developed and well-nourished. She appears distressed.  HENT:  Head: Normocephalic.  Eyes: Conjunctivae are normal.  Cardiovascular: Normal rate, regular rhythm and normal heart sounds.   Respiratory: Effort normal and breath sounds normal.  GI: Soft. There is no tenderness.  Genitourinary: Vagina normal.  Musculoskeletal: Normal range of motion. She exhibits no edema and no tenderness.  Neurological: She is alert and oriented to person, place, and time. She has normal reflexes.  Skin: Skin is warm and dry.  Psychiatric: She has a normal mood and affect.    Prenatal labs: ABO, Rh: O/POS/-- (12/10 1002) Antibody: NEG (12/10 1002) Rubella: 0.77 (12/10 1002) RPR: NON REAC (04/08 1220)  HBsAg: NEGATIVE (  12/10 1002)  HIV: NON-REACTIVE (04/08 1220)  GBS: Negative (05/29 0000)  1 hour GTT 97 Declined genetic screening  Assessment: 1. Labor: Active 2. Fetal Wellbeing: Category I  3. Pain Control: Fentanyl 4. GBS: Neg 5. 38.0 week IUP  Plan:  1. Admit to BS per consult with MD 2. Routine L&D orders 3. Analgesia/anesthesia  PRN   Dorathy KinsmanSMITH, Paton Crum 07/25/2013, 1:37 PM

## 2013-07-25 NOTE — MAU Note (Signed)
Patient presents to rule out labor.

## 2013-07-25 NOTE — Anesthesia Procedure Notes (Signed)
Epidural Patient location during procedure: OB  Staffing Anesthesiologist: Phillips GroutARIGNAN, Paitynn Mikus Performed by: anesthesiologist   Preanesthetic Checklist Completed: patient identified, site marked, surgical consent, pre-op evaluation, timeout performed, IV checked, risks and benefits discussed and monitors and equipment checked  Epidural Patient position: sitting Prep: ChloraPrep Patient monitoring: heart rate, continuous pulse ox and blood pressure Approach: right paramedian Location: L2-L3 Injection technique: LOR saline  Needle:  Needle type: Tuohy  Needle gauge: 17 G Needle length: 9 cm and 9 Needle insertion depth: 5 cm Catheter type: closed end flexible Catheter size: 20 Guage Catheter at skin depth: 10 cm Test dose: negative  Assessment Events: blood not aspirated, injection not painful, no injection resistance, negative IV test and no paresthesia  Additional Notes   Patient tolerated the insertion well without complications.

## 2013-07-26 ENCOUNTER — Encounter (HOSPITAL_COMMUNITY): Payer: Self-pay | Admitting: General Practice

## 2013-07-26 MED ORDER — OXYCODONE-ACETAMINOPHEN 5-325 MG PO TABS
1.0000 | ORAL_TABLET | ORAL | Status: DC | PRN
  Administered 2013-07-26 – 2013-07-27 (×7): 1 via ORAL
  Filled 2013-07-26 (×7): qty 1

## 2013-07-26 MED ORDER — WITCH HAZEL-GLYCERIN EX PADS: 1.0000 "application " | MEDICATED_PAD | CUTANEOUS | Status: DC | PRN

## 2013-07-26 MED ORDER — TETANUS-DIPHTH-ACELL PERTUSSIS 5-2.5-18.5 LF-MCG/0.5 IM SUSP
0.5000 mL | Freq: Once | INTRAMUSCULAR | Status: DC
  Filled 2013-07-26: qty 0.5

## 2013-07-26 MED ORDER — ZOLPIDEM TARTRATE 5 MG PO TABS: 5.0000 mg | ORAL_TABLET | Freq: Every evening | ORAL | Status: DC | PRN

## 2013-07-26 MED ORDER — ONDANSETRON HCL 4 MG PO TABS: 4.0000 mg | ORAL_TABLET | ORAL | Status: DC | PRN

## 2013-07-26 MED ORDER — IBUPROFEN 600 MG PO TABS
600.0000 mg | ORAL_TABLET | Freq: Four times a day (QID) | ORAL | Status: DC
  Administered 2013-07-26 – 2013-07-27 (×7): 600 mg via ORAL
  Filled 2013-07-26 (×7): qty 1

## 2013-07-26 MED ORDER — DIBUCAINE 1 % RE OINT
1.0000 "application " | TOPICAL_OINTMENT | RECTAL | Status: DC | PRN
  Filled 2013-07-26: qty 28

## 2013-07-26 MED ORDER — ONDANSETRON HCL 4 MG/2ML IJ SOLN: 4.0000 mg | INTRAMUSCULAR | Status: DC | PRN

## 2013-07-26 MED ORDER — PRENATAL MULTIVITAMIN CH
1.0000 | ORAL_TABLET | Freq: Every day | ORAL | Status: DC
  Administered 2013-07-26 – 2013-07-27 (×2): 1 via ORAL
  Filled 2013-07-26 (×2): qty 1

## 2013-07-26 MED ORDER — LANOLIN HYDROUS EX OINT: TOPICAL_OINTMENT | CUTANEOUS | Status: DC | PRN

## 2013-07-26 MED ORDER — SIMETHICONE 80 MG PO CHEW: 80.0000 mg | CHEWABLE_TABLET | ORAL | Status: DC | PRN

## 2013-07-26 MED ORDER — DIPHENHYDRAMINE HCL 25 MG PO CAPS: 25.0000 mg | ORAL_CAPSULE | Freq: Four times a day (QID) | ORAL | Status: DC | PRN

## 2013-07-26 MED ORDER — BENZOCAINE-MENTHOL 20-0.5 % EX AERO
1.0000 "application " | INHALATION_SPRAY | CUTANEOUS | Status: DC | PRN
  Administered 2013-07-26: 1 via TOPICAL
  Filled 2013-07-26 (×2): qty 56

## 2013-07-26 MED ORDER — SENNOSIDES-DOCUSATE SODIUM 8.6-50 MG PO TABS
2.0000 | ORAL_TABLET | ORAL | Status: DC
  Administered 2013-07-26 – 2013-07-27 (×2): 2 via ORAL
  Filled 2013-07-26 (×2): qty 2

## 2013-07-26 NOTE — Anesthesia Postprocedure Evaluation (Signed)
Anesthesia Post Note  Patient: Amy Dickson  Procedure(s) Performed: * No procedures listed *  Anesthesia type: Epidural  Patient location: Mother/Baby  Post pain: Pain level controlled  Post assessment: Post-op Vital signs reviewed  Last Vitals:  Filed Vitals:   07/26/13 0620  BP: 103/64  Pulse: 86  Temp: 37.2 C  Resp: 18    Post vital signs: Reviewed  Level of consciousness:alert  Complications: No apparent anesthesia complications

## 2013-07-26 NOTE — Progress Notes (Signed)
I have seen this patient and agree with the above resident's note.  LEFTWICH-KIRBY, Stehle Rattigan Certified Nurse-Midwife 

## 2013-07-26 NOTE — Progress Notes (Signed)
I have seen this patient and agree with the above resident's note.  Discussed contraceptive choices, with LARCs presented as most effective.    LEFTWICH-KIRBY, Jadwiga Faidley Certified Nurse-Midwife

## 2013-07-26 NOTE — Lactation Note (Signed)
This note was copied from the chart of [name]. Lactation Consultation Note Unable to latch baby d/t flat nipples. Noted edema to Lt. Breast, nipple not compressible. Rt. Breast no edema and nipple w/short shaft slightly compressible and will roll in finger tips. Hand pump to pull nipples out. A drop of colostrum noted to nipple. Encouraged to elevate pendulum breast w/wash cloth and feed in football hold for a deep latch. Mom had classes. Baby needed stimulated to feed. Noted good feeding, no swallows heard. Mom encouraged to feed baby 8-12 times/24 hours and with feeding cues. Mom encouraged to waken baby for feeds. Specifics of an asymmetric latch shown. Reviewed Baby & Me book's Breastfeeding Basics. Mom reports + breast changes w/pregnancy. Hand expression taught to Mom. educated about newborn behavior. WH/LC brochure given w/resources, support groups and LC services. Shells given to wear during the day. Encouraged not to wear at night d/t edema. Mom fitted w/NS #20 to Rt. Nipple and #24 to Lt. Nipple.  Patient Name: [name] ZOXWR'UToday's Date: 07/26/2013 Reason for consult: Initial assessment   Maternal Data Infant to breast within first hour of birth: No Breastfeeding delayed due to:: Other (comment) (couldn't latch) Has patient been taught Hand Expression?: Yes Does the patient have breastfeeding experience prior to this delivery?: No  Feeding Feeding Type: Breast Fed Length of feed: 15 min  LATCH Score/Interventions Latch: Repeated attempts needed to sustain latch, nipple held in mouth throughout feeding, stimulation needed to elicit sucking reflex. Intervention(s): Adjust position;Assist with latch;Breast massage;Breast compression  Audible Swallowing: None Intervention(s): Skin to skin;Hand expression Intervention(s): Skin to skin;Hand expression  Type of Nipple: Flat Intervention(s): Shells;Hand pump  Comfort (Breast/Nipple): Soft / non-tender     Hold  (Positioning): Assistance needed to correctly position infant at breast and maintain latch. Intervention(s): Breastfeeding basics reviewed;Support Pillows;Position options;Skin to skin  LATCH Score: 5  Lactation Tools Discussed/Used Tools: Shells;Nipple Dorris CarnesShields;Pump Nipple shield size: 20;24 Shell Type: Inverted Breast pump type: Manual   Consult Status Consult Status: Follow-up Date: 07/26/13 Follow-up type: In-patient    Charyl DancerCARVER, Omaya Nieland G 07/26/2013, 4:28 AM

## 2013-07-26 NOTE — Progress Notes (Signed)
Ur chart review completed.  

## 2013-07-26 NOTE — Progress Notes (Signed)
Post Partum Day 1 Subjective: no complaints, up ad lib, voiding, tolerating PO, + flatus and last BM during delivery  Objective: Blood pressure 103/64, pulse 86, temperature 98.9 F (37.2 C), temperature source Oral, resp. rate 18, height 5\' 4"  (1.626 m), weight 62.596 kg (138 lb), last menstrual period 11/01/2012, SpO2 100.00%, unknown if currently breastfeeding.  Physical Exam:  General: alert, cooperative and no distress Lochia: appropriate Uterine Fundus: firm Incision: N/A DVT Evaluation: No evidence of DVT seen on physical exam. No cords or calf tenderness. No significant calf/ankle edema.   Recent Labs  07/25/13 1020  HGB 10.7*  HCT 33.0*    Assessment/Plan: Discharge home tomorrow Pt breastfeeding MOC unknown   LOS: 1 day   Bing PlumeGervasi, Kristin E 07/26/2013, 7:46 AM

## 2013-07-27 MED ORDER — MEASLES, MUMPS & RUBELLA VAC ~~LOC~~ INJ
0.5000 mL | INJECTION | Freq: Once | SUBCUTANEOUS | Status: AC
  Administered 2013-07-27: 0.5 mL via SUBCUTANEOUS
  Filled 2013-07-27: qty 0.5

## 2013-07-27 MED ORDER — FUROSEMIDE 20 MG PO TABS
20.0000 mg | ORAL_TABLET | Freq: Once | ORAL | Status: AC
  Administered 2013-07-27: 20 mg via ORAL
  Filled 2013-07-27: qty 1

## 2013-07-27 MED ORDER — IBUPROFEN 600 MG PO TABS: 600.0000 mg | ORAL_TABLET | Freq: Four times a day (QID) | ORAL | Status: DC

## 2013-07-27 NOTE — Discharge Summary (Signed)
Attestation of Attending Supervision of Resident: Evaluation and management procedures [facility] under my supervision.  I have seen and examined the patient, reviewed the resident's note and chart, and I agree with the management and plan.  Anibal Hendersonarolyn L Harraway-Smith, M.D. 07/27/2013 8:51 AM

## 2013-07-27 NOTE — Discharge Instructions (Signed)

## 2013-07-27 NOTE — Lactation Note (Signed)
This note was copied from the chart of [name]. Lactation Consultation Note  Patient Name: [name] WGNFA'OToday's Date: 07/27/2013 Reason for consult: Follow-up assessment;NICU baby Mom is having difficulty latching baby due to aerola edema. Nipple flattens with breast compression and does not compress well due to thickness of tissue with swelling. After few attempts and LC assist Baby did latch in cross cradle. Appeared to have good depth with latch, demonstrated a good suckling pattern with some audible swallows. Encouraged Mom to continue to BF with feeding ques, if by 2-3 hours from last feeding Mom has not observed any feeding ques, then place baby STS and see if she will BF. Post pump after feedings to help remove edema and supplement baby with any amount of EBM Mom receives. Encouraged Mom to call for assist as needed.   Maternal Data Formula Feeding for Exclusion: No  Feeding Feeding Type: Breast Milk  LATCH Score/Interventions Latch: Too sleepy or reluctant, no latch achieved, no sucking elicited.  Audible Swallowing: None  Type of Nipple: Flat  Comfort (Breast/Nipple): Filling, red/small blisters or bruises, mild/mod discomfort     Hold (Positioning): Assistance needed to correctly position infant at breast and maintain latch.  LATCH Score: 3  Lactation Tools Discussed/Used Tools: Pump Breast pump type: Double-Electric Breast Pump WIC Program: Yes Pump Review: Setup, frequency, and cleaning Initiated by:: Lactation Date initiated:: 07/27/13   Consult Status Consult Status: Follow-up Date: 07/27/13 Follow-up type: In-patient    [name], Amy Ann 07/27/2013, 1:35 PM

## 2013-07-27 NOTE — Discharge Summary (Signed)
Obstetric Discharge Summary Reason for Admission: onset of labor Prenatal Procedures: ultrasound Intrapartum Procedures: spontaneous vaginal delivery Postpartum Procedures: none Complications-Operative and Postpartum: 1st degree perineal laceration Hemoglobin  Date Value Ref Range Status  07/25/2013 10.7* 12.0 - 15.0 g/dL Final     HCT  Date Value Ref Range Status  07/25/2013 33.0* 36.0 - 46.0 % Final   Amy Dickson is a 23 y.o. year old 751P0000 female at 9085w0d weeks gestation reporting in Labor.  At 10:59 PM a viable female was delivered via Vaginal, Spontaneous Delivery (Presentation: LOA; ). APGAR: 8, 9.  Mother is breast feeding and natural family planning.   Physical Exam:  General: alert, cooperative and no distress Lochia: appropriate Uterine Fundus: firm Incision: n/a DVT Evaluation: No evidence of DVT seen on physical exam.  Discharge Diagnoses: Term Pregnancy-delivered  Discharge Information: Date: 07/27/2013 Activity: unrestricted Diet: routine Medications: Ibuprofen Condition: stable Instructions: refer to practice specific booklet Discharge to: home Follow-up Information   Follow up with [facility]. Schedule an appointment as soon as possible for a visit in 5 weeks. (postpartum follow up)    Contact information:   [address] [place] [address]       Newborn Data: Live born female  Birth Weight: 7 lb 7.4 oz (3385 g) APGAR: 8, 9  Home with mother.  Myra RudeSchmitz, Jeremy E 07/27/2013, 8:48 AM

## 2013-07-27 NOTE — Lactation Note (Signed)
This note was copied from the chart of Amy Maren Beachshley [place]. Lactation Consultation Note  Patient Name: Amy Dickson ZOXWR'UToday's Date: 07/27/2013 Reason for consult: Follow-up assessment;NICU baby  Bedside RN reported concerns w/"clicking".  At consult, baby noted to have difficulty going to breast b/c of edema in both breasts (despite use of nipple shield).  L breast had marked pitting edema noted when helping Mom with hand expression.  Resident called to request a dose of Lasix to help w/edematous breasts that are preventing baby from good latch.  (Baby +DAT, little to no evidence of milk transfer since birth).    Mom's milk is coming in.  Mom to begin pumping to prevent engorgement.   On oral exam, baby noted to have labial frenum, but excellent tongue mobility w/good suction. Clicking not noted w/finger or bottle feeding.    Amy Dickson, Amy Dickson 07/27/2013, 1:23 PM

## 2013-08-15 ENCOUNTER — Encounter (HOSPITAL_COMMUNITY): Payer: Self-pay | Admitting: Emergency Medicine

## 2013-08-15 ENCOUNTER — Emergency Department (HOSPITAL_COMMUNITY)
Admission: EM | Admit: 2013-08-15 | Discharge: 2013-08-15 | Disposition: A | Discharge status: Home / Self Care | Payer: Medicaid Other | Source: Home / Self Care | Attending: Family Medicine | Admitting: Family Medicine

## 2013-08-15 DIAGNOSIS — L03116 Cellulitis of left lower limb: Secondary | ICD-10-CM

## 2013-08-15 DIAGNOSIS — L03119 Cellulitis of unspecified part of limb: Secondary | ICD-10-CM

## 2013-08-15 DIAGNOSIS — L02419 Cutaneous abscess of limb, unspecified: Secondary | ICD-10-CM

## 2013-08-15 MED ORDER — MINOCYCLINE HCL 100 MG PO CAPS: 100.0000 mg | ORAL_CAPSULE | Freq: Two times a day (BID) | ORAL | Status: DC

## 2013-08-15 NOTE — ED Notes (Signed)
Pt    Noticed  A  Red  Swollen  Tender lesion to  l  Leg      X  6  Days  Getting  Worse

## 2013-08-15 NOTE — ED Provider Notes (Signed)
CSN: [id number]     Arrival date & time 08/15/13  1307 History   First MD Initiated Contact with Patient 08/15/13 1324     Chief Complaint  Patient presents with  . Recurrent Skin Infections   (Consider location/radiation/quality/duration/timing/severity/associated sxs/prior Treatment) Patient is a 23 y.o. female presenting with rash. The history is provided by the patient.  Rash Location:  Leg Leg rash location:  L knee Quality: painful, redness and swelling   Pain details:    Quality:  Sore and throbbing   Severity:  Moderate   Duration:  6 days   Progression:  Worsening Severity:  Mild Onset quality:  Sudden Context: insect bite/sting   Associated symptoms: no fatigue     Past Medical History  Diagnosis Date  . Kidney stones 2009   Past Surgical History  Procedure Laterality Date  . Kidney stone surgery     Family History  Problem Relation Age of Onset  . Cancer Maternal Grandmother   . Diabetes Paternal Grandmother    History  Substance Use Topics  . Smoking status: Never Smoker   . Smokeless tobacco: Never Used  . Alcohol Use: No   OB History   Grav Para Term Preterm Abortions TAB SAB Ect Mult Living   1 1 1  0 0 0 0 0 0 1     Review of Systems  Constitutional: Negative.  Negative for chills and fatigue.  Musculoskeletal: Negative.   Skin: Positive for rash.    Allergies  Review of patient's allergies indicates no known allergies.  Home Medications   Prior to Admission medications   Medication Sig Start Date End Date Taking? Authorizing Provider  ibuprofen (ADVIL,MOTRIN) 600 MG tablet Take 1 tablet (600 mg total) by mouth every 6 (six) hours. 07/27/13   Myra RudeJeremy E Schmitz, MD   BP 119/83  Pulse 117  Temp(Src) 98.7 F (37.1 C) (Oral)  Resp 20  SpO2 100% Physical Exam  Nursing note and vitals reviewed. Constitutional: She is oriented to person, place, and time. She appears well-developed and well-nourished.  Neurological: She is alert and oriented  to person, place, and time.  Skin: Skin is warm and dry. Rash noted.  Induration with surrounding erythema with central lesion to left medial knee, no drainage.    ED Course  Procedures (including critical care time) Labs Review Labs Reviewed - No data to display  Imaging Review No results found.   MDM   1. Cellulitis of knee, left        Linna HoffJames D Starlina Lapre, MD 08/15/13 1339

## 2013-08-15 NOTE — Discharge Instructions (Signed)
Warm compress twice a day when you take the antibiotic, take all of medicine, return as needed. °

## 2013-09-03 ENCOUNTER — Ambulatory Visit: Payer: Medicaid Other | Admitting: Nurse Practitioner

## 2013-09-20 ENCOUNTER — Ambulatory Visit: Payer: Medicaid Other | Admitting: Advanced Practice Midwife

## 2013-09-20 ENCOUNTER — Encounter: Payer: Self-pay | Admitting: *Deleted

## 2013-09-20 ENCOUNTER — Telehealth: Payer: Self-pay | Admitting: *Deleted

## 2013-09-20 NOTE — Telephone Encounter (Signed)
Attempted to contact patient, patient has a voicemail box that has not been set up, unable to leave a message.  Will send letter.  Letter sent.

## 2013-09-27 ENCOUNTER — Inpatient Hospital Stay (HOSPITAL_COMMUNITY)
Admission: AD | Admit: 2013-09-27 | Discharge: 2013-09-27 | Disposition: A | Discharge status: Home / Self Care | Payer: Medicaid Other | Source: Ambulatory Visit | Attending: Obstetrics & Gynecology | Admitting: Obstetrics & Gynecology

## 2013-09-27 ENCOUNTER — Encounter (HOSPITAL_COMMUNITY): Payer: Self-pay

## 2013-09-27 DIAGNOSIS — R112 Nausea with vomiting, unspecified: Secondary | ICD-10-CM

## 2013-09-27 DIAGNOSIS — Z3202 Encounter for pregnancy test, result negative: Secondary | ICD-10-CM | POA: Insufficient documentation

## 2013-09-27 LAB — URINALYSIS, ROUTINE W REFLEX MICROSCOPIC
BILIRUBIN URINE: NEGATIVE
GLUCOSE, UA: NEGATIVE mg/dL
HGB URINE DIPSTICK: NEGATIVE
Ketones, ur: NEGATIVE mg/dL
Leukocytes, UA: NEGATIVE
Nitrite: NEGATIVE
Protein, ur: NEGATIVE mg/dL
Specific Gravity, Urine: 1.02 (ref 1.005–1.030)
UROBILINOGEN UA: 0.2 mg/dL (ref 0.0–1.0)
pH: 7 (ref 5.0–8.0)

## 2013-09-27 LAB — POCT PREGNANCY, URINE: PREG TEST UR: NEGATIVE

## 2013-09-27 NOTE — MAU Note (Signed)
Patient states she had a vaginal delivery on 07-25-13,. Has not had a period since the delivery. [facility] States she started vomiting today, no pain or bleeding.

## 2013-09-27 NOTE — Discharge Instructions (Signed)

## 2013-09-27 NOTE — MAU Provider Note (Signed)
CC: Possible Pregnancy and Emesis    First Provider Initiated Contact with Patient 09/27/13 1649      Subjective HPI Amy Dickson 23 y.o. G1P1001 presents to see if she is pregnant. Has had nausea this week and vomited today. Was vegetarian for 8 yrs but ate meat this week. HPT not done. Has a 2 mo old bottle feeding and DNKA for PP visit. Wants to get on Mirena. Denies vaginal bleeding, abdominal pain.  No other concerns.   Significant PMH: Kidney stones  Significant OB history: NSVD Nonsmoker  Objective Blood pressure 112/72, pulse 82, temperature 99 F (37.2 C), temperature source Oral, resp. rate 16, height 5' 5.5" (1.664 m), weight 56.7 kg (125 lb), SpO2 100.00%, unknown if currently breastfeeding.  Physical Exam General: WN, WD female in no apparent distress Abdomen: soft, nontender, without organomegaly  Lab Results Results for orders placed during the hospital encounter of 09/27/13 (from the past 24 hour(s))  URINALYSIS, ROUTINE W REFLEX MICROSCOPIC     Status: None   Collection Time    09/27/13  3:35 PM      Result Value Ref Range   Color, Urine YELLOW  YELLOW   APPearance CLEAR  CLEAR   Specific Gravity, Urine 1.020  1.005 - 1.030   pH 7.0  5.0 - 8.0   Glucose, UA NEGATIVE  NEGATIVE mg/dL   Hgb urine dipstick NEGATIVE  NEGATIVE   Bilirubin Urine NEGATIVE  NEGATIVE   Ketones, ur NEGATIVE  NEGATIVE mg/dL   Protein, ur NEGATIVE  NEGATIVE mg/dL   Urobilinogen, UA 0.2  0.0 - 1.0 mg/dL   Nitrite NEGATIVE  NEGATIVE   Leukocytes, UA NEGATIVE  NEGATIVE  POCT PREGNANCY, URINE     Status: None   Collection Time    09/27/13  4:04 PM      Result Value Ref Range   Preg Test, Ur NEGATIVE  NEGATIVE    Assessment 23 y.o. G1P1001  1. Non-intractable vomiting with nausea, vomiting of unspecified type    Probably food intolerance  Plan  D/C home with reassurance Continue PNV and advised no intercourse for at least 2 wks before PP appointment, preferably abstain.      Medication List         MULTIVITAMIN PO  Take 1 tablet by mouth daily.         Follow-up Information   Follow up with [facility] In 4 weeks. (Someone from Clinic will call you with appt.)    Contact information:   [address] [place] [address] [phone]       Danae OrleansDeirdre C Anthonio Mizzell, CNM 09/27/2013 4:49 PM

## 2013-09-29 ENCOUNTER — Other Ambulatory Visit (HOSPITAL_COMMUNITY): Payer: Self-pay | Admitting: Family Medicine

## 2013-11-04 ENCOUNTER — Ambulatory Visit: Payer: Medicaid Other | Admitting: Family Medicine

## 2013-11-04 ENCOUNTER — Encounter: Payer: Self-pay | Admitting: Obstetrics & Gynecology

## 2013-12-13 ENCOUNTER — Encounter (HOSPITAL_COMMUNITY): Payer: Self-pay

## 2014-07-08 ENCOUNTER — Emergency Department (INDEPENDENT_AMBULATORY_CARE_PROVIDER_SITE_OTHER)
Admission: EM | Admit: 2014-07-08 | Discharge: 2014-07-08 | Disposition: A | Discharge status: Home / Self Care | Payer: Medicaid Other | Source: Home / Self Care | Attending: Family Medicine | Admitting: Family Medicine

## 2014-07-08 ENCOUNTER — Encounter (HOSPITAL_COMMUNITY): Payer: Self-pay | Admitting: Emergency Medicine

## 2014-07-08 DIAGNOSIS — M79604 Pain in right leg: Secondary | ICD-10-CM

## 2014-07-08 DIAGNOSIS — L03113 Cellulitis of right upper limb: Secondary | ICD-10-CM | POA: Diagnosis not present

## 2014-07-08 DIAGNOSIS — M79601 Pain in right arm: Secondary | ICD-10-CM | POA: Diagnosis not present

## 2014-07-08 MED ORDER — IBUPROFEN 800 MG PO TABS: 800.0000 mg | ORAL_TABLET | Freq: Three times a day (TID) | ORAL | Status: DC | PRN

## 2014-07-08 MED ORDER — DOXYCYCLINE HYCLATE 100 MG PO CAPS: 100.0000 mg | ORAL_CAPSULE | Freq: Two times a day (BID) | ORAL | Status: DC

## 2014-07-08 MED ORDER — HYDROCODONE-ACETAMINOPHEN 5-325 MG PO TABS: 1.0000 | ORAL_TABLET | Freq: Four times a day (QID) | ORAL | Status: DC | PRN

## 2014-07-08 NOTE — ED Notes (Signed)
C/o right forearm pain onset 2 days Sx include swelling, tenderness, drainage Doesn't recall any inj/trauma Alert, no signs of acute distress.

## 2014-07-08 NOTE — Discharge Instructions (Signed)
Cellulitis Cellulitis is an infection of the skin and the tissue under the skin. The infected area is usually red and tender. This happens most often in the arms and lower legs. HOME CARE   Take your antibiotic medicine as told. Finish the medicine even if you start to feel better.  Keep the infected arm or leg raised (elevated).  Put a warm cloth on the area up to 4 times per day.  Only take medicines as told by your doctor.  Keep all doctor visits as told. GET HELP IF:  You see red streaks on the skin coming from the infected area.  Your red area gets bigger or turns a dark color.  Your bone or joint under the infected area is painful after the skin heals.  Your infection comes back in the same area or different area.  You have a puffy (swollen) bump in the infected area.  You have new symptoms.  You have a fever. GET HELP RIGHT AWAY IF:   You feel very sleepy.  You throw up (vomit) or have watery poop (diarrhea).  You feel sick and have muscle aches and pains. MAKE SURE YOU:   Understand these instructions.  Will watch your condition.  Will get help right away if you are not doing well or get worse. Document Released: 07/17/2007 Document Revised: 06/14/2013 Document Reviewed: 04/15/2011 [facility] Patient Information 2015 [employer] [place]. This information is not intended to replace advice given to you by your health care provider. Make sure you discuss any questions you have with your health care provider.     You have an infection of the right arm. No abscess is noted at this time. Start Doxycycline today and get BOTH doses in today. F/U here tomorrow around same time for a recheck. Should you start having worsening pain, swelling, or high fevers, then please go to the ER for further evaluation.

## 2014-07-08 NOTE — ED Provider Notes (Signed)
CSN: [id number]     Arrival date & time 07/08/14  1506 History   First MD Initiated Contact with Patient 07/08/14 1645     Chief Complaint  Patient presents with  . Arm Pain   (Consider location/radiation/quality/duration/timing/severity/associated sxs/prior Treatment) HPI Comments: Patient presents today with pain along her right forearm. She noticed mild pain 2 days ago at work. This am she awoke to pain, swelling and warmth of her forearm. She reports no injury. No known wound. No fever, chills. No N,V. Some worsening over the last day.   Patient is a 24 y.o. female presenting with arm pain. The history is provided by the patient.  Arm Pain    Past Medical History  Diagnosis Date  . Kidney stones 2009   Past Surgical History  Procedure Laterality Date  . Kidney stone surgery     Family History  Problem Relation Age of Onset  . Cancer Maternal Grandmother   . Diabetes Paternal Grandmother    History  Substance Use Topics  . Smoking status: Never Smoker   . Smokeless tobacco: Never Used  . Alcohol Use: No   OB History    Gravida Para Term Preterm AB TAB SAB Ectopic Multiple Living   0 0 0 0 0 0 1     Review of Systems  Constitutional: Negative for fever.  Gastrointestinal: Negative for nausea and vomiting.  All other systems reviewed and are negative.   Allergies  Review of patient's allergies indicates no known allergies.  Home Medications   Prior to Admission medications   Medication Sig Start Date End Date Taking? Authorizing Provider  doxycycline (VIBRAMYCIN) 100 MG capsule Take 1 capsule (100 mg total) by mouth 2 (two) times daily. 07/08/14   Riki Sheer, PA-C  HYDROcodone-acetaminophen (NORCO/VICODIN) 5-325 MG per tablet Take 1-2 tablets by mouth every 6 (six) hours as needed for moderate pain. 07/08/14   Riki Sheer, PA-C  ibuprofen (ADVIL,MOTRIN) 800 MG tablet Take 1 tablet (800 mg total) by mouth every 8 (eight) hours as needed for  moderate pain. 07/08/14   Riki Sheer, PA-C  Multiple Vitamins-Minerals (MULTIVITAMIN PO) Take 1 tablet by mouth daily.    Historical Provider, MD   BP 105/77 mmHg  Pulse 72  Temp(Src) 99.5 F (37.5 C) (Oral)  Resp 16  SpO2 100% Physical Exam  Constitutional: She is oriented to person, place, and time. She appears well-developed and well-nourished. No distress.  Non-toxic appearing  Musculoskeletal:  Full ROM of the right elbow and right hand without effusions, erythema or warmth. Warmth and erythema limited to forearm.   Neurological: She is alert and oriented to person, place, and time.  Skin: Skin is warm and dry. She is not diaphoretic. There is erythema.     Small point of entry to right mid forearm anterior aspect without fluctuance or drainage. Warmth and tenderness of the area with approx 7cm of erythema surrounding area. No swelling of the elbow or right hand is noted. See M/S exam  Psychiatric: Her behavior is normal.  Nursing note and vitals reviewed.   ED Course  Procedures (including critical care time) Labs Review Labs Reviewed - No data to display  Imaging Review No results found.   MDM   1. Cellulitis of arm, right   2. Arm pain, central, right    1. Localized infection is noted with small point of entry; probable staph.  No obvious abscess or fluctuance to drain. She is non-toxic. Treat  with Doxycycline, icing and Hydrocodone if needed. Detailed instructions to go to the ER if any worsening symptoms of high fevers, joint involvement or systemic symptoms. Would like her to return here tomorrow to ensure antibiotic therapy is adequate.She expresses understanding.      Riki SheerMichelle G Miyo Aina, PA-C 07/08/14 1717

## 2015-04-04 ENCOUNTER — Encounter (HOSPITAL_COMMUNITY): Payer: Self-pay | Admitting: *Deleted

## 2015-04-04 ENCOUNTER — Emergency Department (HOSPITAL_COMMUNITY)
Admission: EM | Admit: 2015-04-04 | Discharge: 2015-04-04 | Disposition: A | Discharge status: Home / Self Care | Payer: Medicaid Other | Attending: Emergency Medicine | Admitting: Emergency Medicine

## 2015-04-04 DIAGNOSIS — Z5321 Procedure and treatment not carried out due to patient leaving prior to being seen by health care provider: Secondary | ICD-10-CM

## 2015-04-04 DIAGNOSIS — R109 Unspecified abdominal pain: Secondary | ICD-10-CM | POA: Insufficient documentation

## 2015-04-04 LAB — COMPREHENSIVE METABOLIC PANEL
ALBUMIN: 3.8 g/dL (ref 3.5–5.0)
ALT: 14 U/L (ref 14–54)
AST: 23 U/L (ref 15–41)
Alkaline Phosphatase: 53 U/L (ref 38–126)
Anion gap: 11 (ref 5–15)
BUN: 7 mg/dL (ref 6–20)
CO2: 24 mmol/L (ref 22–32)
Calcium: 9.1 mg/dL (ref 8.9–10.3)
Chloride: 103 mmol/L (ref 101–111)
Creatinine, Ser: 0.7 mg/dL (ref 0.44–1.00)
GFR calc non Af Amer: >60 mL/min (ref >60)
Glucose, Bld: 101 mg/dL — ABNORMAL HIGH (ref 65–99)
Potassium: 4 mmol/L (ref 3.5–5.1)
SODIUM: 138 mmol/L (ref 135–145)
Total Bilirubin: 0.2 mg/dL — ABNORMAL LOW (ref 0.3–1.2)
Total Protein: 7.9 g/dL (ref 6.5–8.1)

## 2015-04-04 LAB — CBC
HEMATOCRIT: 38.9 % (ref 36.0–46.0)
HEMOGLOBIN: 12.5 g/dL (ref 12.0–15.0)
MCH: 28.3 pg (ref 26.0–34.0)
MCHC: 32.1 g/dL (ref 30.0–36.0)
MCV: 88 fL (ref 78.0–100.0)
Platelets: 232 10*3/uL (ref 150–400)
RBC: 4.42 MIL/uL (ref 3.87–5.11)
RDW: 13.6 % (ref 11.5–15.5)
WBC: 4.5 10*3/uL (ref 4.0–10.5)

## 2015-04-04 LAB — LIPASE, BLOOD: Lipase: 19 U/L (ref 11–51)

## 2015-04-04 MED ORDER — ONDANSETRON 4 MG PO TBDP
4.0000 mg | ORAL_TABLET | Freq: Once | ORAL | Status: AC
  Administered 2015-04-04: 4 mg via ORAL

## 2015-04-04 MED ORDER — ONDANSETRON 4 MG PO TBDP
ORAL_TABLET | ORAL | Status: AC
  Filled 2015-04-04: qty 1

## 2015-04-04 NOTE — ED Notes (Signed)
Pt did not answer X 3 in lobby

## 2015-04-04 NOTE — ED Notes (Signed)
Pt states that she has had abdominal pain and cramping. Pt states that she has had N/V. Pt states hat her friend also has the same symptoms after eating wendys yesterday.

## 2015-04-04 NOTE — ED Provider Notes (Signed)
Patient left without being seen, I did not see or evaluate them.   Lyndal Pulley, MD 04/04/15 [phone]

## 2016-01-13 ENCOUNTER — Encounter (HOSPITAL_COMMUNITY): Payer: Self-pay | Admitting: Emergency Medicine

## 2016-01-13 ENCOUNTER — Emergency Department (HOSPITAL_COMMUNITY)
Admission: EM | Admit: 2016-01-13 | Discharge: 2016-01-13 | Disposition: A | Discharge status: Home / Self Care | Payer: Medicaid Other | Attending: Dermatology | Admitting: Dermatology

## 2016-01-13 DIAGNOSIS — K0889 Other specified disorders of teeth and supporting structures: Secondary | ICD-10-CM | POA: Diagnosis present

## 2016-01-13 DIAGNOSIS — Z5321 Procedure and treatment not carried out due to patient leaving prior to being seen by health care provider: Secondary | ICD-10-CM | POA: Diagnosis not present

## 2016-01-13 NOTE — ED Triage Notes (Signed)
Pt. Stated, I've had toothache for a year.

## 2016-06-13 ENCOUNTER — Encounter (HOSPITAL_COMMUNITY): Payer: Self-pay | Admitting: Emergency Medicine

## 2016-06-13 ENCOUNTER — Ambulatory Visit (HOSPITAL_COMMUNITY)
Admission: EM | Admit: 2016-06-13 | Discharge: 2016-06-13 | Disposition: A | Discharge status: Home / Self Care | Payer: BLUE CROSS/BLUE SHIELD | Attending: Nurse Practitioner | Admitting: Nurse Practitioner

## 2016-06-13 DIAGNOSIS — K029 Dental caries, unspecified: Secondary | ICD-10-CM | POA: Diagnosis not present

## 2016-06-13 DIAGNOSIS — S025XXB Fracture of tooth (traumatic), initial encounter for open fracture: Secondary | ICD-10-CM | POA: Diagnosis not present

## 2016-06-13 MED ORDER — HYDROCODONE-ACETAMINOPHEN 5-325 MG PO TABS: 1.0000 | ORAL_TABLET | Freq: Four times a day (QID) | ORAL | 0 refills | Status: DC | PRN

## 2016-06-13 MED ORDER — IBUPROFEN 800 MG PO TABS
800.0000 mg | ORAL_TABLET | Freq: Three times a day (TID) | ORAL | 0 refills | Status: DC
Start: 2016-06-13 — End: 2022-02-18

## 2016-06-13 MED ORDER — AMOXICILLIN-POT CLAVULANATE 875-125 MG PO TABS: 1.0000 | ORAL_TABLET | Freq: Two times a day (BID) | ORAL | 0 refills | Status: DC

## 2016-06-13 NOTE — ED Provider Notes (Signed)
CSN: [id number]     Arrival date & time 06/13/16  1840 History   First MD Initiated Contact with Patient 06/13/16 1909     Chief Complaint  Patient presents with  . Dental Pain   (Consider location/radiation/quality/duration/timing/severity/associated sxs/prior Treatment)  Patient presents with complaint of toothache. Onset of symptoms was gradual starting 1 week ago. Patient describes pain as throbbing. Pain severity now is severe. The pain does not radiate. Patient does not have jaw swelling or fever >101. Pain is aggravated by eating, drinking and cold air. The patient denies any other complaints. Care prior to arrival consisted of aleve with minimal relief. Patient has sought treatment by dentistry for this problem and was told that she needed a crown, several root canals and extractions. The patient reports that she does not have the money required to pay for these suggested therapies.           Past Medical History:  Diagnosis Date  . Kidney stones 2009   Past Surgical History:  Procedure Laterality Date  . KIDNEY STONE SURGERY     Family History  Problem Relation Age of Onset  . Cancer Maternal Grandmother   . Diabetes Paternal Grandmother    Social History  Substance Use Topics  . Smoking status: Never Smoker  . Smokeless tobacco: Never Used  . Alcohol use No   OB History    Gravida Para Term Preterm AB Living   1 1 1  0 0 1   SAB TAB Ectopic Multiple Live Births   0 0 0 0 1     Review of Systems  HENT: Positive for dental problem.     Allergies  Patient has no known allergies.  Home Medications   Prior to Admission medications   Medication Sig Start Date End Date Taking? Authorizing Provider  amoxicillin-clavulanate (AUGMENTIN) 875-125 MG tablet Take 1 tablet by mouth every 12 (twelve) hours. 06/13/16   Lurline IdolSamantha Muzamil Harker, FNP  HYDROcodone-acetaminophen (NORCO/VICODIN) 5-325 MG tablet Take 1-2 tablets by mouth every 6 (six) hours as needed. 06/13/16    Lurline IdolSamantha Demitria Hay, FNP  ibuprofen (ADVIL,MOTRIN) 800 MG tablet Take 1 tablet (800 mg total) by mouth 3 (three) times daily. 06/13/16   Lurline IdolSamantha Madeline Bebout, FNP   Meds Ordered and Administered this Visit  Medications - No data to display  BP (!) 132/92 (BP Location: Right Arm)   Pulse 87   Temp 99.6 F (37.6 C) (Oral)   LMP 05/19/2016 (Exact Date)   SpO2 100%  No data found.   Physical Exam  Constitutional: She is oriented to person, place, and time. She appears well-developed and well-nourished.  HENT:  Head: Normocephalic.  Mouth/Throat: Oropharynx is clear and moist. No oropharyngeal exudate.  Multiple dental caries noted throughout. Right lower molar fracture. No bleeding or abscesses noted.  Neck: Normal range of motion. Neck supple.  Cardiovascular: Normal rate and regular rhythm.   Pulmonary/Chest: Effort normal and breath sounds normal.  Musculoskeletal: Normal range of motion.  Neurological: She is alert and oriented to person, place, and time.  Skin: Skin is warm and dry.  Psychiatric: She has a normal mood and affect.    Urgent Care Course     Procedures (including critical care time)  Labs Review Labs Reviewed - No data to display  Imaging Review No results found.   Visual Acuity Review  Right Eye Distance:   Left Eye Distance:   Bilateral Distance:    Right Eye Near:   Left Eye Near:  Bilateral Near:         MDM   1. Dental caries   2. Pain due to dental caries   3. Open fracture of tooth, initial encounter    Treatments: Augmentin, ibuprofen and Norco prescribed. Informed patient of diagnosis and explained that she will continue to have these problems until she receives the recommended therapies by dentistry. Provided a referral to [facility] as they may be able to provide more affordable services for the patient.  Agricultural engineer distributed. Discussed diagnosis and treatment with patient. All questions  have been answered and all concerns have been addressed. The patient verbalized understanding and had no further questions     Lurline Idol, FNP 06/13/16 1925

## 2016-06-13 NOTE — ED Triage Notes (Signed)
Pt has a cracked filling that is causing her a lot of pain.  She has been to a dentist but she states she can't afford the dental work she needs.

## 2020-05-25 ENCOUNTER — Ambulatory Visit (HOSPITAL_COMMUNITY)
Admission: EM | Admit: 2020-05-25 | Discharge: 2020-05-25 | Disposition: A | Discharge status: Home / Self Care | Payer: Self-pay | Attending: Family Medicine | Admitting: Family Medicine

## 2020-05-25 ENCOUNTER — Encounter (HOSPITAL_COMMUNITY): Payer: Self-pay

## 2020-05-25 DIAGNOSIS — S91215A Laceration without foreign body of left lesser toe(s) with damage to nail, initial encounter: Secondary | ICD-10-CM

## 2020-05-25 MED ORDER — MUPIROCIN 2 % EX OINT: 1.0000 "application " | TOPICAL_OINTMENT | Freq: Two times a day (BID) | CUTANEOUS | 0 refills | Status: DC

## 2020-05-25 NOTE — ED Provider Notes (Signed)
[facility]    CSN: [id number] Arrival date & time: 05/25/20  1549      History   Chief Complaint Chief Complaint  Patient presents with  . Laceration    HPI Amy Dickson is a 30 y.o. female.   Patient presenting today with pain to laceration to left little toe that she states happened about 2 and half weeks ago when she kicked the bed frame.  She states she tried to just ignore it and keep it clean and wrapped up for a while but it still very painful especially when walking.  Denies any recent bleeding, drainage, redness, swelling, fever.  Notes the nail feels fairly loose on this toe.      Past Medical History:  Diagnosis Date  . Kidney stones 2009    Patient Active Problem List   Diagnosis Date Noted  . Back spasm 06/02/2013  . Nausea and vomiting in pregnancy prior to [redacted] weeks gestation 01/20/2013  . Encounter for supervision of normal first pregnancy in first trimester 01/20/2013  . 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 KIDNEY STONE SURGERY      OB History    Gravida  1   Para  1   Term  1   Preterm  0   AB  0   Living  1     SAB  0   IAB  0   Ectopic  0   Multiple  0   Live Births  1            Home Medications    Prior to Admission medications   Medication Sig Start Date End Date Taking? Authorizing Provider  mupirocin ointment (BACTROBAN) 2 % Apply 1 application topically 2 (two) times daily. 05/25/20  Yes Particia Nearing, PA-C  amoxicillin-clavulanate (AUGMENTIN) 875-125 MG tablet Take 1 tablet by mouth every 12 (twelve) hours. 06/13/16   Lurline Idol, FNP  HYDROcodone-acetaminophen (NORCO/VICODIN) 5-325 MG tablet Take 1-2 tablets by mouth every 6 (six) hours as needed. 06/13/16   Lurline Idol, FNP  ibuprofen (ADVIL,MOTRIN) 800 MG tablet Take 1 tablet (800 mg total) by mouth 3 (three) times daily. 06/13/16   Lurline Idol, FNP    Family History Family  History  Problem Relation Age of Onset  . Cancer Maternal Grandmother   . Diabetes Paternal Grandmother     Social History Social History   Tobacco Use  . Smoking status: Never Smoker  . Smokeless tobacco: Never Used  Substance Use Topics  . Alcohol use: No  . Drug use: No     Allergies   Patient has no known allergies.   Review of Systems Review of Systems Per HPI Physical Exam Triage Vital Signs ED Triage Vitals  Enc Vitals Group     BP 05/25/20 1805 116/69     Pulse Rate 05/25/20 1805 63     Resp 05/25/20 1805 16     Temp 05/25/20 1805 98.1 F (36.7 C)     Temp Source 05/25/20 1805 Oral     SpO2 --      Weight --      Height --      Head Circumference --      Peak Flow --      Pain Score 05/25/20 1802 5     Pain Loc --      Pain Edu? --      Excl. in GC? --  No data found.  Updated Vital Signs BP 116/69 (BP Location: Right Arm)   Pulse 63   Temp 98.1 F (36.7 C) (Oral)   Resp 16   LMP 05/21/2020 (Exact Date)   Breastfeeding No   Visual Acuity Right Eye Distance:   Left Eye Distance:   Bilateral Distance:    Right Eye Near:   Left Eye Near:    Bilateral Near:     Physical Exam Vitals and nursing note reviewed.  Constitutional:      Appearance: Normal appearance. She is not ill-appearing.  HENT:     Head: Atraumatic.  Eyes:     Extraocular Movements: Extraocular movements intact.     Conjunctiva/sclera: Conjunctivae normal.  Cardiovascular:     Rate and Rhythm: Normal rate and regular rhythm.     Heart sounds: Normal heart sounds.  Pulmonary:     Effort: Pulmonary effort is normal.     Breath sounds: Normal breath sounds.  Musculoskeletal:        General: No swelling, tenderness or deformity. Normal range of motion.     Cervical back: Normal range of motion and neck supple.     Comments: No edema, tenderness to palpation, decreased range of motion of left little toe  Skin:    General: Skin is warm and dry.     Comments:  Well-healing flap laceration to tip of left little toe, left little toenail loose but still attached at the base.  No bleeding, drainage, erythema, edema to area  Neurological:     Mental Status: She is alert and oriented to person, place, and time.     Comments: Left foot neurovascularly intact  Psychiatric:        Mood and Affect: Mood normal.        Thought Content: Thought content normal.        Judgment: Judgment normal.    UC Treatments / Results  Labs (all labs ordered are listed, but only abnormal results are displayed) Labs Reviewed - No data to display  EKG   Radiology No results found.  Procedures Procedures (including critical care time)  Medications Ordered in UC Medications - No data to display  Initial Impression / Assessment and Plan / UC Course  I have reviewed the triage vital signs and the nursing notes.  Pertinent labs & imaging results that were available during my care of the patient were reviewed by me and considered in my medical decision making (see chart for details).     Toe appears to be healing well without difficulty.  No evidence of infection at this time and wound edges are well approximated.  Discussed that nail may eventually fall off.  Epsom salt soaks, ice off-and-on, keep clean and covered.  Mupirocin ointment sent in to keep from getting infected.  Tdap up-to-date and last administered 5 years ago.  Final Clinical Impressions(s) / UC Diagnoses   Final diagnoses:  Laceration of lesser toe of left foot without foreign body with damage to nail, initial encounter   Discharge Instructions   None    ED Prescriptions    Medication Sig Dispense Auth. Provider   mupirocin ointment (BACTROBAN) 2 % Apply 1 application topically 2 (two) times daily. 22 g Particia Nearing, [place]     PDMP not reviewed this encounter.   Particia Nearing, [place] 05/25/20 1847

## 2020-05-25 NOTE — ED Triage Notes (Signed)
Pt reports having a laceration in the left pinky toe x 2 1/2 weeks. Pain worse when walking.  Pt reports she had the Tdap 5 years ago.

## 2022-01-23 ENCOUNTER — Emergency Department (HOSPITAL_COMMUNITY)
Admission: EM | Admit: 2022-01-23 | Discharge: 2022-01-24 | Disposition: A | Discharge status: Home / Self Care | Payer: Medicaid Other | Attending: Emergency Medicine | Admitting: Emergency Medicine

## 2022-01-23 ENCOUNTER — Ambulatory Visit (HOSPITAL_COMMUNITY)
Admission: EM | Admit: 2022-01-23 | Discharge: 2022-01-23 | Disposition: A | Discharge status: Home / Self Care | Payer: Medicaid Other | Attending: Internal Medicine | Admitting: Internal Medicine

## 2022-01-23 ENCOUNTER — Emergency Department (HOSPITAL_COMMUNITY): Payer: Medicaid Other

## 2022-01-23 ENCOUNTER — Other Ambulatory Visit: Payer: Self-pay

## 2022-01-23 ENCOUNTER — Encounter (HOSPITAL_COMMUNITY): Payer: Self-pay | Admitting: Emergency Medicine

## 2022-01-23 DIAGNOSIS — N10 Acute tubulo-interstitial nephritis: Secondary | ICD-10-CM | POA: Diagnosis not present

## 2022-01-23 DIAGNOSIS — R109 Unspecified abdominal pain: Secondary | ICD-10-CM | POA: Diagnosis present

## 2022-01-23 DIAGNOSIS — N12 Tubulo-interstitial nephritis, not specified as acute or chronic: Secondary | ICD-10-CM | POA: Insufficient documentation

## 2022-01-23 DIAGNOSIS — R3129 Other microscopic hematuria: Secondary | ICD-10-CM

## 2022-01-23 LAB — CBC WITH DIFFERENTIAL/PLATELET
Abs Immature Granulocytes: 0.01 10*3/uL (ref 0.00–0.07)
Basophils Absolute: 0 10*3/uL (ref 0.0–0.1)
Basophils Relative: 0 %
Eosinophils Absolute: 0 10*3/uL (ref 0.0–0.5)
Eosinophils Relative: 0 %
HCT: 37.1 % (ref 36.0–46.0)
Hemoglobin: 12.9 g/dL (ref 12.0–15.0)
Immature Granulocytes: 0 %
Lymphocytes Relative: 13 %
Lymphs Abs: 0.8 10*3/uL (ref 0.7–4.0)
MCH: 31.5 pg (ref 26.0–34.0)
MCHC: 34.8 g/dL (ref 30.0–36.0)
MCV: 90.7 fL (ref 80.0–100.0)
Monocytes Absolute: 0.4 10*3/uL (ref 0.1–1.0)
Monocytes Relative: 6 %
Neutro Abs: 4.6 10*3/uL (ref 1.7–7.7)
Neutrophils Relative %: 81 %
Platelets: 206 10*3/uL (ref 150–400)
RBC: 4.09 MIL/uL (ref 3.87–5.11)
RDW: 12.2 % (ref 11.5–15.5)
WBC: 5.8 10*3/uL (ref 4.0–10.5)
nRBC: 0 % (ref 0.0–0.2)

## 2022-01-23 LAB — COMPREHENSIVE METABOLIC PANEL
ALT: 10 U/L (ref 0–44)
AST: 17 U/L (ref 15–41)
Albumin: 3.6 g/dL (ref 3.5–5.0)
Alkaline Phosphatase: 43 U/L (ref 38–126)
Anion gap: 11 (ref 5–15)
BUN: 7 mg/dL (ref 6–20)
CO2: 23 mmol/L (ref 22–32)
Calcium: 8.9 mg/dL (ref 8.9–10.3)
Chloride: 100 mmol/L (ref 98–111)
Creatinine, Ser: 0.85 mg/dL (ref 0.44–1.00)
GFR, Estimated: >60 mL/min (ref >60)
Glucose, Bld: 110 mg/dL — ABNORMAL HIGH (ref 70–99)
Potassium: 3.6 mmol/L (ref 3.5–5.1)
Sodium: 134 mmol/L — ABNORMAL LOW (ref 135–145)
Total Bilirubin: 0.4 mg/dL (ref 0.3–1.2)
Total Protein: 7.6 g/dL (ref 6.5–8.1)

## 2022-01-23 LAB — POCT URINALYSIS DIPSTICK, ED / UC
Bilirubin Urine: NEGATIVE
Glucose, UA: NEGATIVE mg/dL
Ketones, ur: >=160 mg/dL — AB
Nitrite: POSITIVE — AB
Protein, ur: 100 mg/dL — AB
Specific Gravity, Urine: 1.025 (ref 1.005–1.030)
Urobilinogen, UA: 4 mg/dL — ABNORMAL HIGH (ref 0.0–1.0)
pH: 6 (ref 5.0–8.0)

## 2022-01-23 LAB — POC URINE PREG, ED: Preg Test, Ur: NEGATIVE

## 2022-01-23 LAB — I-STAT BETA HCG BLOOD, ED (MC, WL, AP ONLY): I-stat hCG, quantitative: <5 m[IU]/mL (ref <5)

## 2022-01-23 MED ORDER — ONDANSETRON 4 MG PO TBDP
ORAL_TABLET | ORAL | Status: AC
  Filled 2022-01-23: qty 1

## 2022-01-23 MED ORDER — PROCHLORPERAZINE MALEATE 5 MG PO TABS
5.0000 mg | ORAL_TABLET | Freq: Once | ORAL | Status: AC
  Administered 2022-01-23: 5 mg via ORAL
  Filled 2022-01-23: qty 1

## 2022-01-23 MED ORDER — ACETAMINOPHEN 325 MG PO TABS
975.0000 mg | ORAL_TABLET | Freq: Once | ORAL | Status: AC
  Administered 2022-01-23: 975 mg via ORAL

## 2022-01-23 MED ORDER — OXYCODONE-ACETAMINOPHEN 5-325 MG PO TABS
2.0000 | ORAL_TABLET | Freq: Once | ORAL | Status: AC
  Administered 2022-01-23: 1 via ORAL
  Filled 2022-01-23: qty 2

## 2022-01-23 MED ORDER — ONDANSETRON 4 MG PO TBDP
8.0000 mg | ORAL_TABLET | Freq: Once | ORAL | Status: DC
  Filled 2022-01-23: qty 2

## 2022-01-23 MED ORDER — MORPHINE SULFATE (PF) 2 MG/ML IV SOLN
4.0000 mg | Freq: Once | INTRAVENOUS | Status: AC
  Administered 2022-01-23: 4 mg via INTRAMUSCULAR

## 2022-01-23 MED ORDER — MORPHINE SULFATE (PF) 4 MG/ML IV SOLN
INTRAVENOUS | Status: AC
  Filled 2022-01-23: qty 1

## 2022-01-23 MED ORDER — MORPHINE SULFATE (PF) 2 MG/ML IV SOLN: 2.0000 mg | Freq: Once | INTRAVENOUS | Status: DC

## 2022-01-23 MED ORDER — ONDANSETRON 4 MG PO TBDP: 4.0000 mg | ORAL_TABLET | Freq: Once | ORAL | Status: DC

## 2022-01-23 MED ORDER — LACTATED RINGERS IV BOLUS
1000.0000 mL | Freq: Once | INTRAVENOUS | Status: AC
  Administered 2022-01-24: 1000 mL via INTRAVENOUS

## 2022-01-23 MED ORDER — ACETAMINOPHEN 325 MG PO TABS
ORAL_TABLET | ORAL | Status: AC
  Filled 2022-01-23: qty 3

## 2022-01-23 MED ORDER — ACETAMINOPHEN 325 MG PO TABS: 650.0000 mg | ORAL_TABLET | Freq: Once | ORAL | Status: DC

## 2022-01-23 MED ORDER — ONDANSETRON 4 MG PO TBDP
4.0000 mg | ORAL_TABLET | Freq: Once | ORAL | Status: AC
  Administered 2022-01-23: 4 mg via ORAL

## 2022-01-23 NOTE — Discharge Instructions (Addendum)
Your urine shows that you have a significant kidney infection.  I am worried that you might also have a kidney stone complicating things.  I would like for you to go to the nearest emergency room to rule out a more severe infection in your urinary system.  We gave you morphine here, do not drive or drink alcohol because morphine can make you dizzy and drowsy.

## 2022-01-23 NOTE — ED Notes (Signed)
Patient is being discharged from the Urgent Care and sent to the Emergency Department via pov . Per Stevie Kern, NP, patient is in need of higher level of care due to Pyelonephritis. Patient is aware and verbalizes understanding of plan of care.  Vitals:   01/23/22 1439  BP: 99/66  Pulse: 95  Resp: (!) 24  Temp: (!) 102.4 F (39.1 C)  SpO2: 97%

## 2022-01-23 NOTE — ED Triage Notes (Signed)
Pt sent from UC with possible kidney infection and kidney stone. Pt has had lower back pain since Sunday. Pt received morphine, zofran and tylenol at UC. Pt reports not eating since Sunday due to fear of throwing up.

## 2022-01-23 NOTE — ED Provider Notes (Signed)
[facility]    CSN: [id number] Arrival date & time: 01/23/22  1211      History   Chief Complaint Chief Complaint  Patient presents with   Back Pain    HPI Amy Dickson is a 31 y.o. female.   Patient presents to urgent care for evaluation of nausea, back pain, and urinary pressure since Sunday January 20, 2022 (4 days ago). History of kidney stones, believes this to be related to an acute kidney stone. Experiencing chills since onset of symptoms.  Pain is mostly to the right lower back and pelvic region causing patient to believe that there is a stone present to this area causing her to experience sensation of blockage to the urinary tract. Last visit with urology was in 2019. 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 Over the last 3 days, she has been so nauseous she has not been able to eat or drink very much due to fear of vomiting.  Patient is currently nauseous and has not attempted use of any over-the-counter medications prior to arrival urgent care for symptoms.  She denies abdominal pain, diarrhea, gross hematuria, shortness of breath, chest pain, dizziness, URI symptoms.  Pain is currently a 10 on a scale of 0-10, she is unable to sleep well at night due to discomfort.  No recent antibiotic or steroid use.  Denies excessive use of NSAIDs.  She is not a smoker and denies drug use.  Denies chance of pregnancy, she is not currently sexually active.  Last menstrual cycle was 01/07/2022.   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 Authorizing Provider  amoxicillin-clavulanate (AUGMENTIN) 875-125 MG tablet Take 1 tablet by mouth every 12 (twelve) hours. Patient not taking: Reported on 01/23/2022 06/13/16   Lurline IdolMurrill, Samantha, FNP  HYDROcodone-acetaminophen (NORCO/VICODIN) 5-325 MG tablet Take 1-2 tablets by mouth every 6 (six) hours as needed. Patient not taking: Reported on 01/23/2022 06/13/16   Lurline IdolMurrill, Samantha, FNP  ibuprofen (ADVIL,MOTRIN) 800 MG tablet Take 1 tablet (800 mg total) by mouth 3 (three) times daily. Patient not taking: Reported on 01/23/2022 06/13/16   Lurline IdolMurrill, Samantha, FNP  mupirocin ointment (BACTROBAN) 2 % Apply 1 application topically 2 (two) times daily. Patient not taking: Reported on 01/23/2022 05/25/20   Particia NearingLane, Rachel Elizabeth, PA-C    Family History Family History  Problem Relation Age of Onset   Cancer Maternal Grandmother    Diabetes Paternal Grandmother     Social History Social History  Tobacco Use   Smoking status: Never   Smokeless tobacco: Never  Substance Use Topics   Alcohol use: No   Drug use: No     Allergies   Patient has no known allergies.   Review of Systems Review of Systems  Musculoskeletal:  Positive for back pain.  Per HPI   Physical Exam Triage Vital Signs ED Triage Vitals  Enc Vitals Group     BP 01/23/22 1439 99/66     Pulse Rate 01/23/22 1439 95     Resp 01/23/22 1439 (!) 24     Temp 01/23/22 1439 (!) 102.4 F (39.1 C)     Temp Source 01/23/22 1439 Oral     SpO2 01/23/22 1439 97 %     Weight --      Height --      Head Circumference --      Peak Flow --      Pain Score 01/23/22 1436 10     Pain Loc --      Pain Edu? --      Excl. in GC? --    No data  found.  Updated Vital Signs BP 99/66 (BP Location: Left Arm)   Pulse 95   Temp (!) 102.4 F (39.1 C) (Oral)   Resp (!) 24   LMP 01/07/2022   SpO2 97%   Visual Acuity Right Eye Distance:   Left Eye Distance:   Bilateral Distance:    Right Eye Near:   Left Eye Near:    Bilateral Near:     Physical Exam Vitals and nursing note reviewed.  Constitutional:      Appearance: She is ill-appearing. She is not toxic-appearing.  HENT:     Head: Normocephalic and atraumatic.     Right Ear: Hearing and external ear normal.     Left Ear: Hearing and external ear normal.     Nose: Nose normal.     Mouth/Throat:     Lips: Pink.     Mouth: Mucous membranes are dry.     Pharynx: No posterior oropharyngeal erythema.  Eyes:     General: Lids are normal. Vision grossly intact. Gaze aligned appropriately.     Extraocular Movements: Extraocular movements intact.     Conjunctiva/sclera: Conjunctivae normal.  Cardiovascular:     Rate and Rhythm: Normal rate and regular rhythm.     Heart sounds: Normal heart sounds, S1 normal and S2 normal.  Pulmonary:     Effort: Pulmonary effort is normal. No respiratory distress.     Breath sounds: Normal breath sounds and air entry.  Abdominal:     General: Bowel sounds are normal.     Palpations: Abdomen is soft.     Tenderness: There is no abdominal tenderness. There is right CVA tenderness and left CVA tenderness. There is no guarding.  Musculoskeletal:     Cervical back: Neck supple.     Lumbar back: Tenderness present. No swelling, edema, deformity, signs of trauma, lacerations, spasms or bony tenderness. Normal range of motion.       Back:     Comments: Significant tenderness to palpation at area indicated above in the lumbar paraspinals.  Skin:    General: Skin is warm and dry.     Capillary Refill: Capillary refill takes less than 2 seconds.     Findings: No rash.  Neurological:     General: No focal deficit present.     Mental Status: She  is alert and oriented to person,  place, and time. Mental status is at baseline.     Cranial Nerves: No dysarthria or facial asymmetry.     Motor: No weakness.  Psychiatric:        Mood and Affect: Mood normal.        Speech: Speech normal.        Behavior: Behavior normal.        Thought Content: Thought content normal.        Judgment: Judgment normal.      UC Treatments / Results  Labs (all labs ordered are listed, but only abnormal results are displayed) Labs Reviewed  POCT URINALYSIS DIPSTICK, ED / UC - Abnormal; Notable for the following components:      Result Value   Ketones, ur >=160 (*)    Hgb urine dipstick MODERATE (*)    Protein, ur 100 (*)    Urobilinogen, UA 4.0 (*)    Nitrite POSITIVE (*)    Leukocytes,Ua SMALL (*)    All other components within normal limits  POC URINE PREG, ED    EKG   Radiology No results found.  Procedures Procedures (including critical care time)  Medications Ordered in UC Medications  ondansetron (ZOFRAN-ODT) disintegrating tablet 4 mg (4 mg Oral Given 01/23/22 1446)  acetaminophen (TYLENOL) tablet 975 mg (975 mg Oral Given 01/23/22 1522)  morphine (PF) 2 MG/ML injection 4 mg (4 mg Intramuscular Given 01/23/22 1528)    Initial Impression / Assessment and Plan / UC Course  I have reviewed the triage vital signs and the nursing notes.  Pertinent labs & imaging results that were available during my care of the patient were reviewed by me and considered in my medical decision making (see chart for details).   1.  Acute pyelonephritis, microscopic hematuria Significant concern for acute complicated pyelonephritis with secondary nephrolithiasis etiology.  Patient is ill-appearing and appears to be very dehydrated.  She is borderline for meeting SIRS criteria due to elevated heart rate, tachypnea, fever, and hypotension.  Blood pressure is soft at 99/66.  She is not dizzy and is ambulatory with steady gait, however using wheelchair due  to significant tenderness to the low back.  I am concerned that she may have a kidney stone that is too large for her to pass on her own resulting in possible hydronephrosis or acute kidney injury in the setting of pyelonephritis.  Deferred ketorolac administration due to unknown kidney function levels.  Patient given 4 mg of morphine IM prior to leaving urgent care.  Recommend immediate transfer to the ER, patient is stable to go by personal vehicle with her mother.  She needs further workup and evaluation with likely advanced imaging to rule out obstructive nephrolithiasis, AKI, and septic kidney stone. She is agreeable with plan.  Discharge from urgent care in stable condition.  Final Clinical Impressions(s) / UC Diagnoses   Final diagnoses:  Acute pyelonephritis  Other microscopic hematuria     Discharge Instructions      Your urine shows that you have a significant kidney infection.  I am worried that you might also have a kidney stone complicating things.  I would like for you to go to the nearest emergency room to rule out a more severe infection in your urinary system.  We gave you morphine here, do not drive or drink alcohol because morphine can make you dizzy and drowsy.    ED Prescriptions   None    PDMP not reviewed this encounter.   Carlisle Beers,  FNP 01/23/22 1551

## 2022-01-23 NOTE — ED Notes (Signed)
Patient states she has ate and drank very little since Sunday due to being scared that it would make the pain worse.

## 2022-01-23 NOTE — ED Provider Triage Note (Signed)
Emergency Medicine Provider Triage Evaluation Note  Amy Dickson , a 31 y.o. female  was evaluated in triage.  Pt complains of right sided lower back pain since Sunday.  Sent by UC for possible kidney stone and/or pyelonephritis.  Pt received morphine, Zofran, and Tylenol at UC.  She reports not eating since Sunday due to nausea and fear of throwing up.    Review of Systems  Positive: See above Negative: See above  Physical Exam  BP 105/74   Pulse 92   Temp 99.1 F (37.3 C) (Oral)   Resp 20   Ht 5\' 5"  (1.651 m)   Wt 45.4 kg   LMP 01/07/2022   SpO2 100%   BMI 16.64 kg/m  Gen:   Awake, no distress   Resp:  Normal effort  MSK:   Moves extremities without difficulty  Other:  Suprapubic tenderness, Dickson CVA tenderness  Medical Decision Making  Medically screening exam initiated at 5:12 PM.  Appropriate orders placed.  Amy Dickson was informed that the remainder of the evaluation will be completed by another provider, this initial triage assessment does not replace that evaluation, and the importance of remaining in the ED until their evaluation is complete.     Amy Dickson, Amy Dickson 01/23/22 [phone]

## 2022-01-23 NOTE — ED Triage Notes (Signed)
Reports pain started Sunday.  Patient reports she has not been able to eat.  Has been drinking water and juice.  Patient reports overall aching.  Unknown fever. Patient initially determined  this was a kidney stone.   Reports one day of diarrhea.  No vomiting, but intermittently nauseated

## 2022-01-24 LAB — URINALYSIS, ROUTINE W REFLEX MICROSCOPIC
Bacteria, UA: NONE SEEN
Bilirubin Urine: NEGATIVE
Glucose, UA: NEGATIVE mg/dL
Ketones, ur: 20 mg/dL — AB
Nitrite: NEGATIVE
Protein, ur: 100 mg/dL — AB
RBC / HPF: >50 RBC/hpf — ABNORMAL HIGH (ref 0–5)
Specific Gravity, Urine: 1.016 (ref 1.005–1.030)
WBC, UA: >50 WBC/hpf — ABNORMAL HIGH (ref 0–5)
pH: 5 (ref 5.0–8.0)

## 2022-01-24 MED ORDER — SODIUM CHLORIDE 0.9 % IV SOLN
1.0000 g | Freq: Once | INTRAVENOUS | Status: AC
  Administered 2022-01-24: 1 g via INTRAVENOUS
  Filled 2022-01-24: qty 10

## 2022-01-24 MED ORDER — CEPHALEXIN 500 MG PO CAPS: 500.0000 mg | ORAL_CAPSULE | Freq: Four times a day (QID) | ORAL | 0 refills | Status: AC

## 2022-01-24 MED ORDER — LACTATED RINGERS IV BOLUS
1000.0000 mL | Freq: Once | INTRAVENOUS | Status: AC
  Administered 2022-01-24: 1000 mL via INTRAVENOUS

## 2022-01-24 MED ORDER — ONDANSETRON 4 MG PO TBDP: 4.0000 mg | ORAL_TABLET | Freq: Three times a day (TID) | ORAL | 0 refills | Status: DC | PRN

## 2022-01-24 MED ORDER — KETOROLAC TROMETHAMINE 15 MG/ML IJ SOLN
15.0000 mg | Freq: Once | INTRAMUSCULAR | Status: AC
  Administered 2022-01-24: 15 mg via INTRAVENOUS
  Filled 2022-01-24: qty 1

## 2022-01-24 NOTE — ED Provider Notes (Incomplete)
[facility] EMERGENCY DEPARTMENT Provider Note   CSN: [id number] Arrival date & time: 01/23/22  1548     History {Add pertinent medical, surgical, social history, OB history to HPI:1} Chief Complaint  Patient presents with  . Flank Pain  . Back Pain    Amy Dickson is a 31 y.o. female.  HPI     Home Medications Prior to Admission medications   Medication Sig Start Date End Date Taking? Authorizing Provider  amoxicillin-clavulanate (AUGMENTIN) 875-125 MG tablet Take 1 tablet by mouth every 12 (twelve) hours. Patient not taking: Reported on 01/23/2022 06/13/16   Lurline Idol, FNP  HYDROcodone-acetaminophen (NORCO/VICODIN) 5-325 MG tablet Take 1-2 tablets by mouth every 6 (six) hours as needed. Patient not taking: Reported on 01/23/2022 06/13/16   Lurline Idol, FNP  ibuprofen (ADVIL,MOTRIN) 800 MG tablet Take 1 tablet (800 mg total) by mouth 3 (three) times daily. Patient not taking: Reported on 01/23/2022 06/13/16   Lurline Idol, FNP  mupirocin ointment (BACTROBAN) 2 % Apply 1 application topically 2 (two) times daily. Patient not taking: Reported on 01/23/2022 05/25/20   Particia Nearing, PA-C      Allergies    Patient has no known allergies.    Review of Systems   Review of Systems  Physical Exam Updated Vital Signs BP 110/75 (BP Location: Left Arm)   Pulse 92   Temp 99.5 F (37.5 C)   Resp 15   Ht 5\' 5"  (1.651 m)   Wt 45.4 kg   LMP 01/07/2022   SpO2 100%   BMI 16.64 kg/m  Physical Exam  ED Results / Procedures / Treatments   Labs (all labs ordered are listed, but only abnormal results are displayed) Labs Reviewed  COMPREHENSIVE METABOLIC PANEL - Abnormal; Notable for the following components:      Result Value   Sodium 134 (*)    Glucose, Bld 110 (*)    All other components within normal limits  URINE CULTURE  CBC WITH DIFFERENTIAL/PLATELET  URINALYSIS, ROUTINE W REFLEX MICROSCOPIC  I-STAT BETA HCG BLOOD, ED ([facility],  [facility], [facility] ONLY)    EKG None  Radiology CT Renal Stone Study  Result Date: 01/23/2022 CLINICAL DATA:  Abdominal/flank pain.  Stone suspected. EXAM: CT ABDOMEN AND PELVIS WITHOUT CONTRAST TECHNIQUE: Multidetector CT imaging of the abdomen and pelvis was performed following the standard protocol without IV contrast. RADIATION DOSE REDUCTION: This exam was performed according to the departmental dose-optimization program which includes automated exposure control, adjustment of the mA and/or kV according to patient size and/or use of iterative reconstruction technique. COMPARISON:  None Available. FINDINGS: Lower chest: No acute abnormality. Hepatobiliary: No focal liver abnormality is seen. No gallstones, gallbladder wall thickening, or biliary dilatation. Pancreas: Unremarkable. No pancreatic ductal dilatation or surrounding inflammatory changes. Spleen: Normal in size without focal abnormality. Adrenals/Urinary Tract: Adrenal glands are unremarkable. Nonspecific right perinephric fat stranding. No evidence of renal or ureteral calculus or hydronephrosis. Urinary bladder is unremarkable. Evaluation of pyelonephritis is limited on the noncontrast enhanced examination. Bladder is unremarkable. Stomach/Bowel: Stomach is within normal limits. Appendix not identified, however no inflammatory changes in the pericecal region. No evidence of bowel wall thickening, distention, or inflammatory changes. Vascular/Lymphatic: No significant vascular findings are present. No enlarged abdominal or pelvic lymph nodes. Reproductive: Uterus and bilateral adnexa are unremarkable. Other: No abdominal wall hernia or abnormality. No abdominopelvic ascites. Musculoskeletal: No acute or significant osseous findings. IMPRESSION: 1. No evidence of renal or ureteral calculus or hydronephrosis. 2. Nonspecific  right perinephric fat stranding. Evaluation of pyelonephritis is limited on the noncontrast enhanced examination. Correlation with  urinalysis is suggested. 3. Bowel loops are normal in caliber. No evidence of colitis or diverticulitis. Appendix not identified, however no inflammatory changes in the pericecal region. Electronically Signed   By: Larose Hires D.O.   On: 01/23/2022 17:46    Procedures Procedures  {Document cardiac monitor, telemetry assessment procedure when appropriate:1}  Medications Ordered in ED Medications  ondansetron (ZOFRAN-ODT) disintegrating tablet 4 mg (4 mg Oral Not Given 01/23/22 2254)  lactated ringers bolus 1,000 mL (has no administration in time range)  oxyCODONE-acetaminophen (PERCOCET/ROXICET) 5-325 MG per tablet 2 tablet (1 tablet Oral Given 01/23/22 2207)  prochlorperazine (COMPAZINE) tablet 5 mg (5 mg Oral Given 01/23/22 2341)    ED Course/ Medical Decision Making/ A&P                           Medical Decision Making Amount and/or Complexity of Data Reviewed Labs: ordered.  Risk Prescription drug management.   ***  {Document critical care time when appropriate:1} {Document review of labs and clinical decision tools ie heart score, Chads2Vasc2 etc:1}  {Document your independent review of radiology images, and any outside records:1} {Document your discussion with family members, caretakers, and with consultants:1} {Document social determinants of health affecting pt's care:1} {Document your decision making why or why not admission, treatments were needed:1} Final Clinical Impression(s) / ED Diagnoses Final diagnoses:  None    Rx / DC Orders ED Discharge Orders     None

## 2022-01-24 NOTE — Discharge Instructions (Addendum)
You were seen in the ER today for concerns of acute pyelonephritis. Based on urine results and CT study, your symptoms are consistent with pyelonephritis without evidence of renal stones at this time. In this case, outpatient treatment with antibiotics is recommend and symptom control as needed.  A 10-day course of Keflex has been sent in to your pharmacy which you will take four times daily. A prescription for Zofran was also sent in to help with nausea symptoms. Manage fevers and pain with Tylenol and Ibuprofen as needed.  For diet reintroduction, follow BRAT (Banana, Rice, Applesauce, Toast) diet until you feel like you are better able to tolerate foods.

## 2022-01-24 NOTE — ED Provider Notes (Signed)
[facility] EMERGENCY DEPARTMENT Provider Note   CSN: [id number] Arrival date & time: 01/23/22  1548     History Chief Complaint  Patient presents with   Flank Pain   Back Pain    Amy Dickson is a 31 y.o. female.   Flank Pain Pertinent negatives include no chest pain and no shortness of breath.  Back Pain Associated symptoms: no chest pain, no fever and no numbness   Patient presented to the ER after being seen in UC and diagnosed with acute pyelonephritis. UC was concerned about risk of obstructing stone due to patients history of prior nephrolithiasis. Patient is complaining of right sided low back pain worse than left and associated nausea, vomiting, and intermittent headaches. Denies fever, chest pain, shortness of breath, abdominal pain, swelling, or vision changes. UC did not start patient on any antibiotics.    Home Medications Prior to Admission medications   Medication Sig Start Date End Date Taking? Authorizing Provider  cephALEXin (KEFLEX) 500 MG capsule Take 1 capsule (500 mg total) by mouth 4 (four) times daily for 10 days. 01/24/22 02/03/22 Yes Smitty Knudsen, PA-C  ondansetron (ZOFRAN-ODT) 4 MG disintegrating tablet Take 1 tablet (4 mg total) by mouth every 8 (eight) hours as needed for nausea or vomiting. 01/24/22  Yes Maryanna Shape A, PA-C  amoxicillin-clavulanate (AUGMENTIN) 875-125 MG tablet Take 1 tablet by mouth every 12 (twelve) hours. Patient not taking: Reported on 01/23/2022 06/13/16   Lurline Idol, FNP  HYDROcodone-acetaminophen (NORCO/VICODIN) 5-325 MG tablet Take 1-2 tablets by mouth every 6 (six) hours as needed. Patient not taking: Reported on 01/23/2022 06/13/16   Lurline Idol, FNP  ibuprofen (ADVIL,MOTRIN) 800 MG tablet Take 1 tablet (800 mg total) by mouth 3 (three) times daily. Patient not taking: Reported on 01/23/2022 06/13/16   Lurline Idol, FNP  mupirocin ointment (BACTROBAN) 2 % Apply 1 application topically 2  (two) times daily. Patient not taking: Reported on 01/23/2022 05/25/20   Particia Nearing, PA-C      Allergies    Patient has no known allergies.    Review of Systems   Review of Systems  Constitutional:  Positive for appetite change and fatigue. Negative for fever.  Respiratory:  Negative for cough and shortness of breath.   Cardiovascular:  Negative for chest pain and leg swelling.  Gastrointestinal:  Positive for nausea and vomiting.  Genitourinary:  Positive for flank pain.  Musculoskeletal:  Positive for back pain.  Neurological:  Negative for dizziness, light-headedness and numbness.  All other systems reviewed and are negative.   Physical Exam Updated Vital Signs BP (!) 96/52   Pulse 88   Temp 98.9 F (37.2 C) (Oral)   Resp 16   Ht 5\' 5"  (1.651 m)   Wt 45.4 kg   LMP 01/07/2022   SpO2 100%   BMI 16.64 kg/m  Physical Exam Vitals and nursing note reviewed.  Constitutional:      Appearance: Normal appearance. She is not ill-appearing.  HENT:     Head: Normocephalic and atraumatic.  Eyes:     Conjunctiva/sclera: Conjunctivae normal.  Cardiovascular:     Rate and Rhythm: Normal rate and regular rhythm.  Pulmonary:     Effort: Pulmonary effort is normal.     Breath sounds: Normal breath sounds.  Abdominal:     General: Abdomen is flat. Bowel sounds are normal.     Tenderness: There is right CVA tenderness. There is no left CVA tenderness.  Skin:  General: Skin is warm and dry.     Capillary Refill: Capillary refill takes less than 2 seconds.  Neurological:     General: No focal deficit present.     Mental Status: She is alert.     ED Results / Procedures / Treatments   Labs (all labs ordered are listed, but only abnormal results are displayed) Labs Reviewed  COMPREHENSIVE METABOLIC PANEL - Abnormal; Notable for the following components:      Result Value   Sodium 134 (*)    Glucose, Bld 110 (*)    All other components within normal limits   URINALYSIS, ROUTINE W REFLEX MICROSCOPIC - Abnormal; Notable for the following components:   APPearance CLOUDY (*)    Hgb urine dipstick MODERATE (*)    Ketones, ur 20 (*)    Protein, ur 100 (*)    Leukocytes,Ua LARGE (*)    RBC / HPF >50 (*)    WBC, UA >50 (*)    Non Squamous Epithelial 0-5 (*)    All other components within normal limits  URINE CULTURE  CBC WITH DIFFERENTIAL/PLATELET  I-STAT BETA HCG BLOOD, ED ([facility], [facility], [facility] ONLY)    EKG None  Radiology CT Renal Stone Study  Result Date: 01/23/2022 CLINICAL DATA:  Abdominal/flank pain.  Stone suspected. EXAM: CT ABDOMEN AND PELVIS WITHOUT CONTRAST TECHNIQUE: Multidetector CT imaging of the abdomen and pelvis was performed following the standard protocol without IV contrast. RADIATION DOSE REDUCTION: This exam was performed according to the departmental dose-optimization program which includes automated exposure control, adjustment of the mA and/or kV according to patient size and/or use of iterative reconstruction technique. COMPARISON:  None Available. FINDINGS: Lower chest: No acute abnormality. Hepatobiliary: No focal liver abnormality is seen. No gallstones, gallbladder wall thickening, or biliary dilatation. Pancreas: Unremarkable. No pancreatic ductal dilatation or surrounding inflammatory changes. Spleen: Normal in size without focal abnormality. Adrenals/Urinary Tract: Adrenal glands are unremarkable. Nonspecific right perinephric fat stranding. No evidence of renal or ureteral calculus or hydronephrosis. Urinary bladder is unremarkable. Evaluation of pyelonephritis is limited on the noncontrast enhanced examination. Bladder is unremarkable. Stomach/Bowel: Stomach is within normal limits. Appendix not identified, however no inflammatory changes in the pericecal region. No evidence of bowel wall thickening, distention, or inflammatory changes. Vascular/Lymphatic: No significant vascular findings are present. No enlarged abdominal or  pelvic lymph nodes. Reproductive: Uterus and bilateral adnexa are unremarkable. Other: No abdominal wall hernia or abnormality. No abdominopelvic ascites. Musculoskeletal: No acute or significant osseous findings. IMPRESSION: 1. No evidence of renal or ureteral calculus or hydronephrosis. 2. Nonspecific right perinephric fat stranding. Evaluation of pyelonephritis is limited on the noncontrast enhanced examination. Correlation with urinalysis is suggested. 3. Bowel loops are normal in caliber. No evidence of colitis or diverticulitis. Appendix not identified, however no inflammatory changes in the pericecal region. Electronically Signed   By: Keane Police D.O.   On: 01/23/2022 17:46    Procedures Procedures   Medications Ordered in ED Medications  ondansetron (ZOFRAN-ODT) disintegrating tablet 4 mg (4 mg Oral Not Given 01/23/22 2254)  oxyCODONE-acetaminophen (PERCOCET/ROXICET) 5-325 MG per tablet 2 tablet (1 tablet Oral Given 01/23/22 2207)  prochlorperazine (COMPAZINE) tablet 5 mg (5 mg Oral Given 01/23/22 2341)  lactated ringers bolus 1,000 mL (0 mLs Intravenous Stopped 01/24/22 0107)  ketorolac (TORADOL) 15 MG/ML injection 15 mg (15 mg Intravenous Given 01/24/22 0017)  cefTRIAXone (ROCEPHIN) 1 g in sodium chloride 0.9 % 100 mL IVPB (0 g Intravenous Stopped 01/24/22 0230)  lactated ringers bolus 1,000  mL (1,000 mLs Intravenous New Bag/Given 01/24/22 0215)    ED Course/ Medical Decision Making/ A&P                           Medical Decision Making Amount and/or Complexity of Data Reviewed Labs: ordered.  Risk Prescription drug management.   This patient presents to the ED for concern of acute pyelonephritis. Differential diagnosis includes cystitis, nephrolithiasis, sepsis, PE, low back strain    Additional history obtained:  Additional history obtained from mother External records from outside source obtained and reviewed including UC notes from visit today   Lab Tests:  I  Ordered, and personally interpreted labs.  The pertinent results include:  Decreased sodium of 134, all other labs reassuring.  Urinalysis shows obvious infection, urine culture ordered.   Imaging Studies ordered:  I ordered imaging studies including CT renal stone study  I independently visualized and interpreted imaging which showed no evidence of nephrolithiasis or hydronephrosis I agree with the radiologist interpretation   Medicines ordered and prescription drug management:  I ordered medication including reglan  for nausea, lactated ringers bolus for dehydration, toradol for low back/flank pain  Reevaluation of the patient after these medicines showed that the patient improved I have reviewed the patients home medicines and have made adjustments as needed   Problem List / ED Course:  Patient presented to the ER after being seen at [facility] for acute pyelonephritis. UC appeared to be concerned about risk of obstructing stone due to patient's history of prior nephrolithiasis, but CT renal study was reassuring with no evidence of stone seen at this time. Based on UC urine, assumption is that acute pyelonephritis is causing these symptoms but urine was collected and pending here. Patient was given doses of percocet, compazine, fluids, and toradol for symptom control. Patient condition appeared to be improved significantly without acute concerns. Patient was rehydrated with 2L of LR and reported improvement in general feeling. Prescription for Keflex 10 days sent to pharmacy and one time dose of 1g rocephin given prior to discharge. Patient was agreeable to discharge home and verbalized return precautions.  Final Clinical Impression(s) / ED Diagnoses Final diagnoses:  Pyelonephritis    Rx / DC Orders ED Discharge Orders          Ordered    cephALEXin (KEFLEX) 500 MG capsule  4 times daily        01/24/22 0157    ondansetron (ZOFRAN-ODT) 4 MG disintegrating tablet  Every 8 hours PRN         01/24/22 0157              Luvenia Heller, PA-C 01/24/22 0328    Maudie Flakes, MD 01/24/22 [id number]

## 2022-01-24 NOTE — ED Notes (Signed)
Patient verbalizes understanding of d/c instructions. Opportunities for questions and answers were provided. Pt d/c from ED and ambulated to lobby.  

## 2022-01-25 LAB — URINE CULTURE

## 2022-02-18 ENCOUNTER — Ambulatory Visit (INDEPENDENT_AMBULATORY_CARE_PROVIDER_SITE_OTHER): Payer: Medicaid Other

## 2022-02-18 ENCOUNTER — Ambulatory Visit (HOSPITAL_COMMUNITY)
Admission: EM | Admit: 2022-02-18 | Discharge: 2022-02-18 | Disposition: A | Discharge status: Home / Self Care | Payer: Medicaid Other | Attending: Internal Medicine | Admitting: Internal Medicine

## 2022-02-18 DIAGNOSIS — R10817 Generalized abdominal tenderness: Secondary | ICD-10-CM

## 2022-02-18 DIAGNOSIS — R109 Unspecified abdominal pain: Secondary | ICD-10-CM | POA: Diagnosis not present

## 2022-02-18 DIAGNOSIS — N390 Urinary tract infection, site not specified: Secondary | ICD-10-CM | POA: Diagnosis present

## 2022-02-18 LAB — COMPREHENSIVE METABOLIC PANEL
ALT: 8 U/L (ref 0–44)
AST: 16 U/L (ref 15–41)
Albumin: 3.2 g/dL — ABNORMAL LOW (ref 3.5–5.0)
Alkaline Phosphatase: 43 U/L (ref 38–126)
Anion gap: 11 (ref 5–15)
BUN: <5 mg/dL — ABNORMAL LOW (ref 6–20)
CO2: 24 mmol/L (ref 22–32)
Calcium: 8.4 mg/dL — ABNORMAL LOW (ref 8.9–10.3)
Chloride: 96 mmol/L — ABNORMAL LOW (ref 98–111)
Creatinine, Ser: 0.71 mg/dL (ref 0.44–1.00)
GFR, Estimated: >60 mL/min (ref >60)
Glucose, Bld: 115 mg/dL — ABNORMAL HIGH (ref 70–99)
Potassium: 3.2 mmol/L — ABNORMAL LOW (ref 3.5–5.1)
Sodium: 131 mmol/L — ABNORMAL LOW (ref 135–145)
Total Bilirubin: 0.6 mg/dL (ref 0.3–1.2)
Total Protein: 7.2 g/dL (ref 6.5–8.1)

## 2022-02-18 LAB — CBC WITH DIFFERENTIAL/PLATELET
Abs Immature Granulocytes: 0.03 10*3/uL (ref 0.00–0.07)
Basophils Absolute: 0 10*3/uL (ref 0.0–0.1)
Basophils Relative: 0 %
Eosinophils Absolute: 0 10*3/uL (ref 0.0–0.5)
Eosinophils Relative: 0 %
HCT: 34.2 % — ABNORMAL LOW (ref 36.0–46.0)
Hemoglobin: 12.3 g/dL (ref 12.0–15.0)
Immature Granulocytes: 0 %
Lymphocytes Relative: 10 %
Lymphs Abs: 0.9 10*3/uL (ref 0.7–4.0)
MCH: 32 pg (ref 26.0–34.0)
MCHC: 36 g/dL (ref 30.0–36.0)
MCV: 89.1 fL (ref 80.0–100.0)
Monocytes Absolute: 0.9 10*3/uL (ref 0.1–1.0)
Monocytes Relative: 10 %
Neutro Abs: 7 10*3/uL (ref 1.7–7.7)
Neutrophils Relative %: 80 %
Platelets: 159 10*3/uL (ref 150–400)
RBC: 3.84 MIL/uL — ABNORMAL LOW (ref 3.87–5.11)
RDW: 13.1 % (ref 11.5–15.5)
WBC: 8.8 10*3/uL (ref 4.0–10.5)
nRBC: 0 % (ref 0.0–0.2)

## 2022-02-18 LAB — POC URINE PREG, ED: Preg Test, Ur: NEGATIVE

## 2022-02-18 LAB — POCT URINALYSIS DIPSTICK, ED / UC
Bilirubin Urine: NEGATIVE
Glucose, UA: NEGATIVE mg/dL
Ketones, ur: >=160 mg/dL — AB
Nitrite: POSITIVE — AB
Protein, ur: 100 mg/dL — AB
Specific Gravity, Urine: 1.02 (ref 1.005–1.030)
Urobilinogen, UA: 0.2 mg/dL (ref 0.0–1.0)
pH: 6 (ref 5.0–8.0)

## 2022-02-18 LAB — POCT RAPID STREP A, ED / UC: Streptococcus, Group A Screen (Direct): NEGATIVE

## 2022-02-18 MED ORDER — ONDANSETRON 4 MG PO TBDP
4.0000 mg | ORAL_TABLET | Freq: Once | ORAL | Status: AC
  Administered 2022-02-18: 4 mg via ORAL

## 2022-02-18 MED ORDER — LIDOCAINE HCL (PF) 1 % IJ SOLN
INTRAMUSCULAR | Status: AC
  Filled 2022-02-18: qty 2

## 2022-02-18 MED ORDER — CEFTRIAXONE SODIUM 1 G IJ SOLR
INTRAMUSCULAR | Status: AC
  Filled 2022-02-18: qty 10

## 2022-02-18 MED ORDER — SULFAMETHOXAZOLE-TRIMETHOPRIM 800-160 MG PO TABS: 1.0000 | ORAL_TABLET | Freq: Two times a day (BID) | ORAL | 0 refills | Status: DC

## 2022-02-18 MED ORDER — ONDANSETRON 4 MG PO TBDP
ORAL_TABLET | ORAL | Status: AC
  Filled 2022-02-18: qty 1

## 2022-02-18 MED ORDER — CEFTRIAXONE SODIUM 1 G IJ SOLR
1.0000 g | Freq: Once | INTRAMUSCULAR | Status: AC
  Administered 2022-02-18: 1 g via INTRAMUSCULAR

## 2022-02-18 MED ORDER — ACETAMINOPHEN 325 MG PO TABS
650.0000 mg | ORAL_TABLET | Freq: Once | ORAL | Status: AC
  Administered 2022-02-18: 650 mg via ORAL

## 2022-02-18 MED ORDER — ACETAMINOPHEN 325 MG PO TABS
ORAL_TABLET | ORAL | Status: AC
  Filled 2022-02-18: qty 2

## 2022-02-18 MED ORDER — ONDANSETRON 4 MG PO TBDP: 4.0000 mg | ORAL_TABLET | Freq: Three times a day (TID) | ORAL | 0 refills | Status: AC | PRN

## 2022-02-18 NOTE — ED Provider Notes (Signed)
[facility]    CSN: [id number] Arrival date & time: 02/18/22  1303      History   Chief Complaint Chief Complaint  Patient presents with   Dysuria    HPI Amy Dickson is a 32 y.o. female.   Patient presents today with several week history of recurrent abdominal pain.  She was seen by our clinic the middle of December at which point she had a severe UTI and was sent to the emergency room where they diagnosed her with pyelonephritis.  Urine culture was obtained but grew multiple species.  She did complete course of antibiotics and had improvement but never resolution of symptoms.  Recently she has had worsening lower back/abdominal pain which is rated 10 on a 0-10 pain scale, described as aching, no aggravating relieving factors identified.  She denies additional antibiotic use since that time.  She does have a history of nephrolithiasis but states current symptoms are not similar to previous episodes of this condition.  She did contact her urologist but has not seen them in more than 3 years so was told that she needed to reestablish with them.  She does not currently have a primary care provider.  She denies any recent urogenital procedures, self-catheterization.  She does report decreased oral intake as result of ongoing nausea and vomiting as well as intermittent fevers.    Past Medical History:  Diagnosis Date   Kidney stones 2009    Patient Active Problem List   Diagnosis Date Noted   Back spasm 06/02/2013   Nausea and vomiting in pregnancy prior to [redacted] weeks gestation 01/20/2013   Encounter for supervision of normal first pregnancy in first trimester 01/20/2013   NEPHROLITHIASIS 09/24/2007    Past Surgical History:  Procedure Laterality Date   KIDNEY STONE SURGERY      OB History     Gravida  1   Para  1   Term  1   Preterm  0   AB  0   Living  1      SAB  0   IAB  0   Ectopic  0   Multiple  0   Live Births  1            Home  Medications    Prior to Admission medications   Medication Sig Start Date End Date Taking? Authorizing Provider  sulfamethoxazole-trimethoprim (BACTRIM DS) 800-160 MG tablet Take 1 tablet by mouth 2 (two) times daily for 7 days. 02/18/22 02/25/22 Yes Kaniel Kiang K, PA-C  ondansetron (ZOFRAN-ODT) 4 MG disintegrating tablet Take 1 tablet (4 mg total) by mouth every 8 (eight) hours as needed for nausea or vomiting. 02/18/22   Everard Interrante, Noberto Retort, PA-C    Family History Family History  Problem Relation Age of Onset   Cancer Maternal Grandmother    Diabetes Paternal Grandmother     Social History Social History   Tobacco Use   Smoking status: Never   Smokeless tobacco: Never  Substance Use Topics   Alcohol use: No   Drug use: No     Allergies   Patient has no known allergies.   Review of Systems Review of Systems  Constitutional:  Positive for activity change, appetite change and fever. Negative for fatigue.  Gastrointestinal:  Positive for abdominal pain, nausea and vomiting. Negative for diarrhea.  Genitourinary:  Positive for dysuria, frequency and urgency. Negative for vaginal bleeding, vaginal discharge and vaginal pain.  Musculoskeletal:  Positive for back pain.  Negative for arthralgias and myalgias.     Physical Exam Triage Vital Signs ED Triage Vitals  Enc Vitals Group     BP 02/18/22 1631 111/77     Pulse --      Resp --      Temp --      Temp Source 02/18/22 1631 Oral     SpO2 02/18/22 1631 98 %     Weight --      Height --      Head Circumference --      Peak Flow --      Pain Score 02/18/22 1625 10     Pain Loc --      Pain Edu? --      Excl. in GC? --    No data found.  Updated Vital Signs BP 117/77 (BP Location: Left Arm)   Pulse 92   Temp (!) 100.5 F (38.1 C) (Oral)   Resp 16   LMP 01/07/2022   SpO2 97%   Visual Acuity Right Eye Distance:   Left Eye Distance:   Bilateral Distance:    Right Eye Near:   Left Eye Near:    Bilateral Near:      Physical Exam Vitals reviewed.  Constitutional:      General: She is awake. She is not in acute distress.    Appearance: Normal appearance. She is well-developed. She is ill-appearing.     Comments: Very pleasant female laying on exam table in no acute distress  HENT:     Head: Normocephalic and atraumatic.  Cardiovascular:     Rate and Rhythm: Normal rate and regular rhythm.     Heart sounds: Normal heart sounds, S1 normal and S2 normal. No murmur heard. Pulmonary:     Effort: Pulmonary effort is normal.     Breath sounds: Normal breath sounds. No wheezing, rhonchi or rales.     Comments: Clear to auscultation bilaterally Abdominal:     General: Bowel sounds are normal.     Palpations: Abdomen is soft.     Tenderness: There is generalized abdominal tenderness. There is no right CVA tenderness, left CVA tenderness, guarding or rebound.  Musculoskeletal:     Cervical back: No tenderness or bony tenderness.     Thoracic back: No tenderness or bony tenderness.     Lumbar back: Tenderness present. No bony tenderness.  Psychiatric:        Behavior: Behavior is cooperative.      UC Treatments / Results  Labs (all labs ordered are listed, but only abnormal results are displayed) Labs Reviewed  CBC WITH DIFFERENTIAL/PLATELET - Abnormal; Notable for the following components:      Result Value   RBC 3.84 (*)    HCT 34.2 (*)    All other components within normal limits  POCT URINALYSIS DIPSTICK, ED / UC - Abnormal; Notable for the following components:   Ketones, ur >=160 (*)    Hgb urine dipstick LARGE (*)    Protein, ur 100 (*)    Nitrite POSITIVE (*)    Leukocytes,Ua TRACE (*)    All other components within normal limits  URINE CULTURE  COMPREHENSIVE METABOLIC PANEL  POC URINE PREG, ED  POCT RAPID STREP A, ED / UC    EKG   Radiology DG Abdomen 1 View  Result Date: 02/18/2022 CLINICAL DATA:  Follow-up for kidney infection.  Still with pain. EXAM: ABDOMEN - 1 VIEW  COMPARISON:  10/12/2007.  CT, 01/23/2022. FINDINGS: Normal bowel gas  pattern. No evidence of renal or ureteral stones. Soft tissues are unremarkable. Normal skeletal structures. IMPRESSION: 1. Normal exam. Electronically Signed   By: Lajean Manes M.D.   On: 02/18/2022 17:29    Procedures Procedures (including critical care time)  Medications Ordered in UC Medications  cefTRIAXone (ROCEPHIN) injection 1 g (1 g Intramuscular Given 02/18/22 1721)  ondansetron (ZOFRAN-ODT) disintegrating tablet 4 mg (4 mg Oral Given 02/18/22 1720)  acetaminophen (TYLENOL) tablet 650 mg (650 mg Oral Given 02/18/22 1801)    Initial Impression / Assessment and Plan / UC Course  I have reviewed the triage vital signs and the nursing notes.  Pertinent labs & imaging results that were available during my care of the patient were reviewed by me and considered in my medical decision making (see chart for details).  Clinical Course as of 02/18/22 1850  Mon Feb 18, 2022  1705 POCT Rapid Strep A (ED/UC) [ER]    Clinical Course User Index [ER] Vail Basista, Derry Skill, PA-C    Patient was initially febrile and tachycardic but this improved with dose of acetaminophen in clinic.  Urine pregnancy was negative.  UA did show evidence of UTI including positive nitrates.  KUB was obtained given history of nephrolithiasis which showed no radiopaque calculi.  She was given 1 g of Rocephin in clinic as well as Zofran which help manage symptoms and she was able to pass oral challenge.  CBC was obtained which showed no evidence of leukocytosis or significant anemia.  CMP is pending and if she has significant abnormal kidney function she will need to go to the emergency room.  She was started on Bactrim DS twice daily for 7 days.  Discussed that if she develops any rash or lesions she needs to stop the medication and be seen immediately.  Recommended follow-up with urology was given contact information for local provider with instruction to see them  as soon as possible.  Urine culture was obtained and we discussed potential need to change antibiotics based on susceptibilities identified on culture.  She does not currently have a primary care so we will try to establish her with someone via PCP assistance.  Discussed that she should return in 24 to 48 hours if she has not seen urology or PCP for reevaluation.  We also discussed that she will need to have her urine rechecked within a few weeks and if this cannot be done with her primary care/urology it can be done with our clinic.  She is to drink plenty of fluids.  Discussed that if she has any worsening or changing symptoms including persistent fever, nausea/vomiting interfere with oral intake, weakness, nausea/vomiting interfrontal intake she needs to go to the emergency room immediately.  Strict return precautions given to which she expressed understanding.  School excuse note provided.  Final Clinical Impressions(s) / UC Diagnoses   Final diagnoses:  Complicated UTI (urinary tract infection)     Discharge Instructions      Continue using Zofran as needed for nausea and vomiting.  Eat a bland diet and drink plenty of fluid.  We gave an injection of antibiotics today and I would like you to start Bactrim DS twice daily.  If you develop any rash or lesions stop the medication to be seen immediately.  Someone should see within a few days.  If you are unable to establish with primary care or urology please come back here.  We also need to recheck your urine in a few weeks and if  you are unable to see urology or PCP please return here for that as well.  We will contact you if we need to change your treatment plan based on your lab results.  If you have any worsening symptoms including fever, nausea/vomiting interfrontal intake, weakness, dizziness you need to be seen immediately.     ED Prescriptions     Medication Sig Dispense Auth. Provider   ondansetron (ZOFRAN-ODT) 4 MG disintegrating  tablet Take 1 tablet (4 mg total) by mouth every 8 (eight) hours as needed for nausea or vomiting. 20 tablet Shakeela Rabadan K, PA-C   sulfamethoxazole-trimethoprim (BACTRIM DS) 800-160 MG tablet Take 1 tablet by mouth 2 (two) times daily for 7 days. 14 tablet Travoris Bushey, Noberto Retort, PA-C      PDMP not reviewed this encounter.   Jeani Hawking, PA-C 02/18/22 1850

## 2022-02-18 NOTE — Discharge Instructions (Addendum)
Continue using Zofran as needed for nausea and vomiting.  Eat a bland diet and drink plenty of fluid.  We gave an injection of antibiotics today and I would like you to start Bactrim DS twice daily.  If you develop any rash or lesions stop the medication to be seen immediately.  Someone should see within a few days.  If you are unable to establish with primary care or urology please come back here.  We also need to recheck your urine in a few weeks and if you are unable to see urology or PCP please return here for that as well.  We will contact you if we need to change your treatment plan based on your lab results.  If you have any worsening symptoms including fever, nausea/vomiting interfrontal intake, weakness, dizziness you need to be seen immediately.

## 2022-02-18 NOTE — ED Triage Notes (Addendum)
Pt is here for follow up kidney infection. Pt state she still has pain . Pt has been not being able to eat.

## 2022-02-20 ENCOUNTER — Telehealth (HOSPITAL_COMMUNITY): Payer: Self-pay | Admitting: Emergency Medicine

## 2022-02-20 LAB — URINE CULTURE: Culture: >=100000 — AB

## 2022-02-20 MED ORDER — CIPROFLOXACIN HCL 500 MG PO TABS: 500.0000 mg | ORAL_TABLET | Freq: Two times a day (BID) | ORAL | 0 refills | Status: AC

## 2022-02-22 ENCOUNTER — Encounter (HOSPITAL_COMMUNITY): Payer: Self-pay

## 2022-02-22 ENCOUNTER — Ambulatory Visit (HOSPITAL_COMMUNITY)
Admission: RE | Admit: 2022-02-22 | Discharge: 2022-02-22 | Disposition: A | Discharge status: Home / Self Care | Payer: Medicaid Other | Source: Ambulatory Visit | Attending: Internal Medicine | Admitting: Internal Medicine

## 2022-02-22 VITALS — BP 109/75 | HR 81 | Temp 98.1°F | Resp 18

## 2022-02-22 DIAGNOSIS — Z0189 Encounter for other specified special examinations: Secondary | ICD-10-CM | POA: Diagnosis not present

## 2022-02-22 DIAGNOSIS — N390 Urinary tract infection, site not specified: Secondary | ICD-10-CM | POA: Diagnosis not present

## 2022-02-22 DIAGNOSIS — Z09 Encounter for follow-up examination after completed treatment for conditions other than malignant neoplasm: Secondary | ICD-10-CM | POA: Diagnosis not present

## 2022-02-22 LAB — COMPREHENSIVE METABOLIC PANEL
ALT: 14 U/L (ref 0–44)
AST: 18 U/L (ref 15–41)
Albumin: 3.2 g/dL — ABNORMAL LOW (ref 3.5–5.0)
Alkaline Phosphatase: 50 U/L (ref 38–126)
Anion gap: 9 (ref 5–15)
BUN: <5 mg/dL — ABNORMAL LOW (ref 6–20)
CO2: 28 mmol/L (ref 22–32)
Calcium: 8.7 mg/dL — ABNORMAL LOW (ref 8.9–10.3)
Chloride: 100 mmol/L (ref 98–111)
Creatinine, Ser: 0.6 mg/dL (ref 0.44–1.00)
GFR, Estimated: >60 mL/min (ref >60)
Glucose, Bld: 95 mg/dL (ref 70–99)
Potassium: 3.8 mmol/L (ref 3.5–5.1)
Sodium: 137 mmol/L (ref 135–145)
Total Bilirubin: 0.2 mg/dL — ABNORMAL LOW (ref 0.3–1.2)
Total Protein: 7.4 g/dL (ref 6.5–8.1)

## 2022-02-22 LAB — CBC
HCT: 34.8 % — ABNORMAL LOW (ref 36.0–46.0)
Hemoglobin: 12 g/dL (ref 12.0–15.0)
MCH: 31.3 pg (ref 26.0–34.0)
MCHC: 34.5 g/dL (ref 30.0–36.0)
MCV: 90.6 fL (ref 80.0–100.0)
Platelets: 256 10*3/uL (ref 150–400)
RBC: 3.84 MIL/uL — ABNORMAL LOW (ref 3.87–5.11)
RDW: 13.5 % (ref 11.5–15.5)
WBC: 2.9 10*3/uL — ABNORMAL LOW (ref 4.0–10.5)
nRBC: 0 % (ref 0.0–0.2)

## 2022-02-22 LAB — POCT URINALYSIS DIPSTICK, ED / UC
Bilirubin Urine: NEGATIVE
Glucose, UA: NEGATIVE mg/dL
Hgb urine dipstick: NEGATIVE
Leukocytes,Ua: NEGATIVE
Nitrite: NEGATIVE
Protein, ur: 100 mg/dL — AB
Specific Gravity, Urine: >=1.03 (ref 1.005–1.030)
Urobilinogen, UA: 1 mg/dL (ref 0.0–1.0)
pH: 5.5 (ref 5.0–8.0)

## 2022-02-22 NOTE — Discharge Instructions (Signed)
Continue and complete antibiotic.  Blood work is pending.  Will call if anything is abnormal.  Please follow-up if symptoms persist or worsen.

## 2022-02-22 NOTE — ED Triage Notes (Addendum)
Pt states was here on Monday for a kidney infection and was told to return today for a f/u visit. States was called to stop Bactrim and start Cipro. Denies any sx's at this time.

## 2022-02-22 NOTE — ED Provider Notes (Signed)
[facility]    CSN: [id number] Arrival date & time: 02/22/22  1445      History   Chief Complaint Chief Complaint  Patient presents with   Follow-up    HPI Amy Dickson is a 32 y.o. female.   Patient presents for follow-up after recently being treated for complicated urinary tract infection on 02/18/2022.  Patient was originally treated with Bactrim and IM Rocephin.  After urine culture, patient was advised to stop Bactrim and start Cipro.  She states that she did start taking this medication yesterday.  Patient reports that she feels much better.  She still has some persistent lower abdominal pain that is very mild and intermittent.  Otherwise, she is asymptomatic.  She denies any associated fever.  She is able to tolerate food and fluids now.  Patient also reports that she was told to have repeat blood work as well so this is another reason that she is here.     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 Authorizing Provider  ciprofloxacin (CIPRO) 500 MG tablet Take 1 tablet (500 mg total) by mouth 2 (two) times daily for 7 days. 02/20/22 02/27/22  LampteyBritta Mccreedy, MD  ondansetron (ZOFRAN-ODT) 4 MG disintegrating tablet Take 1 tablet (4 mg total) by mouth every 8 (eight) hours as needed for nausea or vomiting. 02/18/22    Raspet, Noberto Retort, PA-C    Family History Family History  Problem Relation Age of Onset   Cancer Maternal Grandmother    Diabetes Paternal Grandmother     Social History Social History   Tobacco Use   Smoking status: Never   Smokeless tobacco: Never  Substance Use Topics   Alcohol use: No   Drug use: No     Allergies   Patient has no known allergies.   Review of Systems Review of Systems Per HPI  Physical Exam Triage Vital Signs ED Triage Vitals [02/22/22 1514]  Enc Vitals Group     BP 109/75     Pulse Rate 81     Resp 18     Temp 98.1 F (36.7 C)     Temp Source Oral     SpO2 96 %     Weight      Height      Head Circumference      Peak Flow      Pain Score 0     Pain Loc      Pain Edu?      Excl. in GC?    No data  found.  Updated Vital Signs BP 109/75 (BP Location: Right Arm)   Pulse 81   Temp 98.1 F (36.7 C) (Oral)   Resp 18   LMP 01/15/2022   SpO2 96%   Visual Acuity Right Eye Distance:   Left Eye Distance:   Bilateral Distance:    Right Eye Near:   Left Eye Near:    Bilateral Near:     Physical Exam Constitutional:      General: She is not in acute distress.    Appearance: Normal appearance. She is not toxic-appearing or diaphoretic.  HENT:     Head: Normocephalic and atraumatic.  Eyes:     Extraocular Movements: Extraocular movements intact.     Conjunctiva/sclera: Conjunctivae normal.  Cardiovascular:     Rate and Rhythm: Normal rate and regular rhythm.     Pulses: Normal pulses.     Heart sounds: Normal heart sounds.  Pulmonary:     Effort: Pulmonary effort is normal. No respiratory distress.     Breath sounds: Normal breath sounds.  Abdominal:     General: Bowel sounds are normal. There is no distension.     Palpations: Abdomen is soft.     Tenderness: There is no abdominal tenderness.  Neurological:     General: No focal deficit present.     Mental Status: She is alert and oriented to person, place, and time. Mental  status is at baseline.  Psychiatric:        Mood and Affect: Mood normal.        Behavior: Behavior normal.        Thought Content: Thought content normal.        Judgment: Judgment normal.      UC Treatments / Results  Labs (all labs ordered are listed, but only abnormal results are displayed) Labs Reviewed  COMPREHENSIVE METABOLIC PANEL - Abnormal; Notable for the following components:      Result Value   BUN <5 (*)    Calcium 8.7 (*)    Albumin 3.2 (*)    Total Bilirubin 0.2 (*)    All other components within normal limits  CBC - Abnormal; Notable for the following components:   WBC 2.9 (*)    RBC 3.84 (*)    HCT 34.8 (*)    All other components within normal limits  POCT URINALYSIS DIPSTICK, ED / UC - Abnormal; Notable for the following components:   Ketones, ur TRACE (*)    Protein, ur 100 (*)    All other components within normal limits    EKG   Radiology No results found.  Procedures Procedures (including critical care time)  Medications Ordered in UC Medications - No data to display  Initial Impression / Assessment and Plan / UC Course  I have reviewed the triage vital signs and the nursing notes.  Pertinent labs & imaging results that were available during my care of the patient were reviewed by me and considered in my medical decision making (see chart for details).     Patient appears physically well, vital signs are normal, she states she is feeling much better.  Therefore, it appears that previous treatment regimen is effective.  Patient advised to continue and complete Cipro antibiotic.  Will obtain repeat blood work of CMP and CBC to check if it has improved.  Patient is tolerating fluids as well which is reassuring.  She was advised at previous urgent care to follow-up with urology so this is same treatment plan as  well.  Patient advised to follow-up if any symptoms persist or recur.  Discussed return precautions.  Patient verbalized understanding  and was agreeable with plan. Final Clinical Impressions(s) / UC Diagnoses   Final diagnoses:  Follow-up exam  Complicated urinary tract infection     Discharge Instructions      Continue and complete antibiotic.  Blood work is pending.  Will call if anything is abnormal.  Please follow-up if symptoms persist or worsen.    ED Prescriptions   None    PDMP not reviewed this encounter.   Teodora Medici, [place] 02/22/22 [phone]

## 2022-03-01 ENCOUNTER — Encounter (HOSPITAL_COMMUNITY): Payer: Self-pay

## 2023-03-07 ENCOUNTER — Ambulatory Visit (HOSPITAL_COMMUNITY)
Admission: EM | Admit: 2023-03-07 | Discharge: 2023-03-07 | Disposition: A | Discharge status: Home / Self Care | Payer: Medicaid Other | Attending: Internal Medicine | Admitting: Internal Medicine

## 2023-03-07 ENCOUNTER — Encounter (HOSPITAL_COMMUNITY): Payer: Self-pay | Admitting: *Deleted

## 2023-03-07 DIAGNOSIS — K0889 Other specified disorders of teeth and supporting structures: Secondary | ICD-10-CM

## 2023-03-07 DIAGNOSIS — K047 Periapical abscess without sinus: Secondary | ICD-10-CM

## 2023-03-07 MED ORDER — IBUPROFEN 800 MG PO TABS: 800.0000 mg | ORAL_TABLET | Freq: Three times a day (TID) | ORAL | 0 refills | Status: AC

## 2023-03-07 MED ORDER — AMOXICILLIN-POT CLAVULANATE 875-125 MG PO TABS: 1.0000 | ORAL_TABLET | Freq: Two times a day (BID) | ORAL | 0 refills | Status: AC

## 2023-03-07 NOTE — Discharge Instructions (Addendum)
Your dental pain is likely due to dental infection. Take  antibiotic as prescribed for the next 7 days to treat your dental infection. Continue use of ibuprofen as needed with food for dental inflammation and pain.   You may also use tylenol as needed for pain. Perform salt water gargles every 3-4 hours.  If you develop any new or worsening symptoms or if your symptoms do not start to improve, pleases return here or follow-up with your primary care provider. If your symptoms are severe, please go to the emergency room.

## 2023-03-07 NOTE — ED Triage Notes (Signed)
Pt states that she has a broken tooth on top left side X 1 week. She called dentist and they can't see her until second week in February. She is taking 2 goody powders and tylenol 500mg  but states that makes her sleepy.

## 2023-03-07 NOTE — ED Provider Notes (Addendum)
[facility]    CSN: [id number] Arrival date & time: 03/07/23  1334      History   Chief Complaint Chief Complaint  Patient presents with   Dental Pain    HPI Amy Dickson is a 33 y.o. female.   Patient presents to urgent care for evaluation of left upper dental pain that started approximately 1 week ago.  She has a cavity to the left upper mouth that was filled when she was a child and states the filling has come out. She tried putting over the counter cavity filler in it's place but it has not helped with symptoms.  Dental pain has worsened significantly over the last 3 to 4 days and she also reports cold sensitivity to the affected tooth.  Denies recent fevers, chills, nausea, vomiting, diarrhea, abdominal pain, cough, sore throat, ear pain, or recent antibiotic/steroid use in the last 90 days.  No recent injuries to mouth. No history of immunosuppression reported.  She is taking Goody powders and Tylenol without much relief of pain.  She has a dentist appointment scheduled for February 10 (2 weeks) to have the tooth pulled but is concerned that the tooth may be infected now.      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 Authorizing Provider  amoxicillin-clavulanate (AUGMENTIN) 875-125 MG tablet Take 1 tablet by  mouth every 12 (twelve) hours. 03/07/23  Yes Carlisle Beers, FNP  ibuprofen (ADVIL) 800 MG tablet Take 1 tablet (800 mg total) by mouth 3 (three) times daily. 03/07/23  Yes Carlisle Beers, FNP  ondansetron (ZOFRAN-ODT) 4 MG disintegrating tablet Take 1 tablet (4 mg total) by mouth every 8 (eight) hours as needed for nausea or vomiting. 02/18/22   Raspet, Noberto Retort, PA-C    Family History Family History  Problem Relation Age of Onset   Cancer Maternal Grandmother    Diabetes Paternal Grandmother     Social History Social History   Tobacco Use   Smoking status: Never   Smokeless tobacco: Never  Vaping Use   Vaping status: Never Used  Substance Use Topics   Alcohol use: No   Drug use: No     Allergies   Patient has no known allergies.   Review of Systems Review of Systems Per HPI  Physical Exam Triage Vital Signs ED Triage Vitals  Encounter Vitals Group     BP 03/07/23 1427 126/84     Systolic BP Percentile --      Diastolic BP Percentile --  Pulse Rate 03/07/23 1427 89     Resp 03/07/23 1427 18     Temp 03/07/23 1427 99 F (37.2 C)     Temp Source 03/07/23 1427 Oral     SpO2 03/07/23 1427 96 %     Weight --      Height --      Head Circumference --      Peak Flow --      Pain Score 03/07/23 1425 10     Pain Loc --      Pain Education --      Exclude from Growth Chart --    No data found.  Updated Vital Signs BP 126/84 (BP Location: Left Arm)   Pulse 89   Temp 99 F (37.2 C) (Oral)   Resp 18   LMP  (LMP Unknown)   SpO2 96%   Visual Acuity Right Eye Distance:   Left Eye Distance:   Bilateral Distance:    Right Eye Near:   Left Eye Near:    Bilateral Near:     Physical Exam Vitals and nursing note reviewed.  Constitutional:      Appearance: She is not ill-appearing or toxic-appearing.  HENT:     Head: Normocephalic and atraumatic.     Right Ear: Hearing, tympanic membrane, ear canal and external ear normal.     Left Ear:  Hearing, tympanic membrane, ear canal and external ear normal.     Nose: Nose normal.     Mouth/Throat:     Lips: Pink.     Mouth: Mucous membranes are moist. No injury or oral lesions.     Dentition: Abnormal dentition. Does not have dentures. Dental tenderness and dental caries present. No gingival swelling, dental abscesses or gum lesions.     Tongue: No lesions.     Pharynx: Oropharynx is clear. Uvula midline. No pharyngeal swelling, oropharyngeal exudate, posterior oropharyngeal erythema, uvula swelling or postnasal drip.     Tonsils: No tonsillar exudate.      Comments: No appreciable dental abscess. No trismus or evidence of ludwig's angina.  Eyes:     General: Lids are normal. Vision grossly intact. Gaze aligned appropriately.     Extraocular Movements: Extraocular movements intact.     Conjunctiva/sclera: Conjunctivae normal.  Neck:     Trachea: Trachea and phonation normal.  Pulmonary:     Effort: Pulmonary effort is normal.  Musculoskeletal:     Cervical back: Neck supple.  Lymphadenopathy:     Head:     Left side of head: Submental adenopathy present.     Cervical: Cervical adenopathy present.     Right cervical: Superficial cervical adenopathy present.     Left cervical: Superficial cervical adenopathy and deep cervical adenopathy present.  Skin:    General: Skin is warm and dry.     Capillary Refill: Capillary refill takes less than 2 seconds.     Findings: No rash.  Neurological:     General: No focal deficit present.     Mental Status: She is alert and oriented to person, place, and time. Mental status is at baseline.     Cranial Nerves: No dysarthria or facial asymmetry.  Psychiatric:        Mood and Affect: Mood normal.        Speech: Speech normal.        Behavior: Behavior normal.        Thought Content: Thought content normal.        Judgment:  Judgment normal.     UC Treatments / Results  Labs (all labs ordered are listed, but only abnormal results  are displayed) Labs Reviewed - No data to display  EKG   Radiology No results found.  Procedures Procedures (including critical care time)  Medications Ordered in UC Medications - No data to display  Initial Impression / Assessment and Plan / UC Course  I have reviewed the triage vital signs and the nursing notes.  Pertinent labs & imaging results that were available during my care of the patient were reviewed by me and considered in my medical decision making (see chart for details).   1. Infected tooth, dental pain Evaluation suggests dental pain secondary to dental infection.   HEENT exam stable and without red flag signs indicating need for advanced imaging/further emergent workup. Patient is afebrile, nontoxic in appearance, and with hemodynamically stable vital signs.   Antibiotic ordered.  Recommend supportive care for symptomatic relief as outlined in AVS.   Counseled patient on potential for adverse effects with medications prescribed/recommended today, strict ER and return-to-clinic precautions discussed, patient verbalized understanding.    Final Clinical Impressions(s) / UC Diagnoses   Final diagnoses:  Infected tooth  Pain, dental     Discharge Instructions      Your dental pain is likely due to dental infection. Take  antibiotic as prescribed for the next 7 days to treat your dental infection. Continue use of ibuprofen as needed with food for dental inflammation and pain.   You may also use tylenol as needed for pain. Perform salt water gargles every 3-4 hours.  If you develop any new or worsening symptoms or if your symptoms do not start to improve, pleases return here or follow-up with your primary care provider. If your symptoms are severe, please go to the emergency room.    ED Prescriptions     Medication Sig Dispense Auth. Provider   amoxicillin-clavulanate (AUGMENTIN) 875-125 MG tablet Take 1 tablet by mouth every 12 (twelve) hours. 14  tablet Reita May M, FNP   ibuprofen (ADVIL) 800 MG tablet Take 1 tablet (800 mg total) by mouth 3 (three) times daily. 21 tablet Carlisle Beers, FNP      PDMP not reviewed this encounter.   Carlisle Beers, FNP 03/07/23 1534    Carlisle Beers, FNP 03/07/23 1534
